# Patient Record
Sex: Female | Born: 1980 | Race: White | Hispanic: No | Marital: Married | Smoking: Former smoker
Health system: Southern US, Community
[De-identification: ages and names within clinical notes are randomized; demographics above are authoritative.]

## PROBLEM LIST (undated history)

## (undated) DIAGNOSIS — I10 Essential (primary) hypertension: Secondary | ICD-10-CM

## (undated) DIAGNOSIS — N200 Calculus of kidney: Secondary | ICD-10-CM

## (undated) HISTORY — PX: CHOLECYSTECTOMY: SHX55

## (undated) HISTORY — PX: ABDOMINAL HYSTERECTOMY: SHX81

---

## 2001-12-27 ENCOUNTER — Emergency Department (HOSPITAL_COMMUNITY): Admission: EM | Admit: 2001-12-27 | Discharge: 2001-12-27 | Payer: Self-pay | Admitting: Emergency Medicine

## 2002-02-26 ENCOUNTER — Emergency Department (HOSPITAL_COMMUNITY): Admission: EM | Admit: 2002-02-26 | Discharge: 2002-02-26 | Payer: Self-pay | Admitting: Emergency Medicine

## 2003-07-07 ENCOUNTER — Emergency Department (HOSPITAL_COMMUNITY): Admission: EM | Admit: 2003-07-07 | Discharge: 2003-07-07 | Payer: Self-pay | Admitting: Emergency Medicine

## 2003-09-13 ENCOUNTER — Emergency Department (HOSPITAL_COMMUNITY): Admission: EM | Admit: 2003-09-13 | Discharge: 2003-09-13 | Payer: Self-pay | Admitting: Emergency Medicine

## 2004-04-04 ENCOUNTER — Emergency Department (HOSPITAL_COMMUNITY): Admission: EM | Admit: 2004-04-04 | Discharge: 2004-04-04 | Payer: Self-pay | Admitting: Emergency Medicine

## 2004-08-12 ENCOUNTER — Emergency Department (HOSPITAL_COMMUNITY): Admission: EM | Admit: 2004-08-12 | Discharge: 2004-08-12 | Payer: Self-pay | Admitting: Emergency Medicine

## 2006-04-14 ENCOUNTER — Emergency Department (HOSPITAL_COMMUNITY): Admission: EM | Admit: 2006-04-14 | Discharge: 2006-04-14 | Payer: Self-pay | Admitting: Emergency Medicine

## 2006-04-17 ENCOUNTER — Emergency Department (HOSPITAL_COMMUNITY): Admission: EM | Admit: 2006-04-17 | Discharge: 2006-04-17 | Payer: Self-pay | Admitting: Emergency Medicine

## 2006-10-16 ENCOUNTER — Emergency Department (HOSPITAL_COMMUNITY): Admission: EM | Admit: 2006-10-16 | Discharge: 2006-10-16 | Payer: Self-pay | Admitting: Emergency Medicine

## 2008-03-04 ENCOUNTER — Emergency Department (HOSPITAL_COMMUNITY): Admission: EM | Admit: 2008-03-04 | Discharge: 2008-03-04 | Payer: Self-pay | Admitting: Emergency Medicine

## 2008-08-01 ENCOUNTER — Emergency Department (HOSPITAL_COMMUNITY): Admission: EM | Admit: 2008-08-01 | Discharge: 2008-08-01 | Payer: Self-pay | Admitting: Emergency Medicine

## 2008-12-01 ENCOUNTER — Emergency Department (HOSPITAL_COMMUNITY): Admission: EM | Admit: 2008-12-01 | Discharge: 2008-12-01 | Payer: Self-pay | Admitting: Emergency Medicine

## 2009-04-17 ENCOUNTER — Emergency Department (HOSPITAL_COMMUNITY): Admission: EM | Admit: 2009-04-17 | Discharge: 2009-04-17 | Payer: Self-pay | Admitting: Emergency Medicine

## 2009-06-11 ENCOUNTER — Emergency Department (HOSPITAL_COMMUNITY): Admission: EM | Admit: 2009-06-11 | Discharge: 2009-06-11 | Payer: Self-pay | Admitting: Emergency Medicine

## 2009-06-16 ENCOUNTER — Emergency Department (HOSPITAL_COMMUNITY): Admission: EM | Admit: 2009-06-16 | Discharge: 2009-06-16 | Payer: Self-pay | Admitting: Emergency Medicine

## 2010-04-30 ENCOUNTER — Emergency Department (HOSPITAL_COMMUNITY)
Admission: EM | Admit: 2010-04-30 | Discharge: 2010-04-30 | Disposition: A | Discharge status: Home / Self Care | Payer: PRIVATE HEALTH INSURANCE | Attending: Emergency Medicine | Admitting: Emergency Medicine

## 2010-04-30 DIAGNOSIS — K2900 Acute gastritis without bleeding: Secondary | ICD-10-CM | POA: Insufficient documentation

## 2010-04-30 DIAGNOSIS — R109 Unspecified abdominal pain: Secondary | ICD-10-CM | POA: Insufficient documentation

## 2010-04-30 DIAGNOSIS — D649 Anemia, unspecified: Secondary | ICD-10-CM | POA: Insufficient documentation

## 2010-04-30 LAB — DIFFERENTIAL
Basophils Relative: 0 % (ref 0–1)
Eosinophils Absolute: 0.2 10*3/uL (ref 0.0–0.7)
Eosinophils Relative: 2 % (ref 0–5)
Lymphs Abs: 2.7 10*3/uL (ref 0.7–4.0)
Monocytes Absolute: 0.6 10*3/uL (ref 0.1–1.0)
Monocytes Relative: 6 % (ref 3–12)
Neutro Abs: 6.6 10*3/uL (ref 1.7–7.7)
Neutrophils Relative %: 66 % (ref 43–77)

## 2010-04-30 LAB — COMPREHENSIVE METABOLIC PANEL
ALT: 26 U/L (ref 0–35)
AST: 39 U/L — ABNORMAL HIGH (ref 0–37)
Albumin: 3.7 g/dL (ref 3.5–5.2)
Alkaline Phosphatase: 77 U/L (ref 39–117)
CO2: 27 mEq/L (ref 19–32)
Calcium: 9.4 mg/dL (ref 8.4–10.5)
Chloride: 102 mEq/L (ref 96–112)
Creatinine, Ser: 0.56 mg/dL (ref 0.4–1.2)
GFR calc Af Amer: >60 mL/min (ref >60)
GFR calc non Af Amer: >60 mL/min (ref >60)

## 2010-04-30 LAB — CBC
Hemoglobin: 9.4 g/dL — ABNORMAL LOW (ref 12.0–15.0)
MCV: 76.9 fL — ABNORMAL LOW (ref 78.0–100.0)
Platelets: 376 10*3/uL (ref 150–400)

## 2010-04-30 LAB — URINALYSIS, ROUTINE W REFLEX MICROSCOPIC
Bilirubin Urine: NEGATIVE
Glucose, UA: NEGATIVE mg/dL
Nitrite: NEGATIVE
Urobilinogen, UA: 0.2 mg/dL (ref 0.0–1.0)

## 2010-04-30 LAB — URINE MICROSCOPIC-ADD ON

## 2010-04-30 LAB — POCT PREGNANCY, URINE: Preg Test, Ur: NEGATIVE

## 2010-04-30 LAB — LIPASE, BLOOD: Lipase: 25 U/L (ref 11–59)

## 2010-05-19 LAB — URINALYSIS, ROUTINE W REFLEX MICROSCOPIC
Glucose, UA: 100 mg/dL — AB
Ketones, ur: NEGATIVE mg/dL
Ketones, ur: NEGATIVE mg/dL
Leukocytes, UA: NEGATIVE
Nitrite: POSITIVE — AB
Specific Gravity, Urine: >1.03 — ABNORMAL HIGH (ref 1.005–1.030)
pH: 6 (ref 5.0–8.0)

## 2010-05-19 LAB — URINE MICROSCOPIC-ADD ON

## 2010-05-19 LAB — PREGNANCY, URINE: Preg Test, Ur: NEGATIVE

## 2010-06-03 LAB — URINALYSIS, ROUTINE W REFLEX MICROSCOPIC
Bilirubin Urine: NEGATIVE
Glucose, UA: NEGATIVE mg/dL
Hgb urine dipstick: NEGATIVE
Ketones, ur: NEGATIVE mg/dL
Nitrite: NEGATIVE
Protein, ur: NEGATIVE mg/dL
Specific Gravity, Urine: 1.025 (ref 1.005–1.030)
Urobilinogen, UA: 0.2 mg/dL (ref 0.0–1.0)
pH: 5.5 (ref 5.0–8.0)

## 2010-06-03 LAB — URINE CULTURE
Colony Count: NO GROWTH
Culture: NO GROWTH

## 2010-09-10 ENCOUNTER — Emergency Department (HOSPITAL_COMMUNITY)
Admission: EM | Admit: 2010-09-10 | Discharge: 2010-09-10 | Disposition: A | Discharge status: Home / Self Care | Payer: PRIVATE HEALTH INSURANCE | Attending: Emergency Medicine | Admitting: Emergency Medicine

## 2010-09-10 ENCOUNTER — Emergency Department (HOSPITAL_COMMUNITY): Payer: PRIVATE HEALTH INSURANCE

## 2010-09-10 ENCOUNTER — Encounter: Payer: Self-pay | Admitting: Emergency Medicine

## 2010-09-10 DIAGNOSIS — E119 Type 2 diabetes mellitus without complications: Secondary | ICD-10-CM | POA: Insufficient documentation

## 2010-09-10 DIAGNOSIS — S93409A Sprain of unspecified ligament of unspecified ankle, initial encounter: Secondary | ICD-10-CM

## 2010-09-10 DIAGNOSIS — W010XXA Fall on same level from slipping, tripping and stumbling without subsequent striking against object, initial encounter: Secondary | ICD-10-CM | POA: Insufficient documentation

## 2010-09-10 DIAGNOSIS — Z87891 Personal history of nicotine dependence: Secondary | ICD-10-CM | POA: Insufficient documentation

## 2010-09-10 DIAGNOSIS — S8000XA Contusion of unspecified knee, initial encounter: Secondary | ICD-10-CM

## 2010-09-10 DIAGNOSIS — IMO0002 Reserved for concepts with insufficient information to code with codable children: Secondary | ICD-10-CM | POA: Insufficient documentation

## 2010-09-10 MED ORDER — ONDANSETRON HCL 4 MG PO TABS
4.0000 mg | ORAL_TABLET | Freq: Once | ORAL | Status: AC
  Administered 2010-09-10: 4 mg via ORAL
  Filled 2010-09-10: qty 1

## 2010-09-10 MED ORDER — HYDROCODONE-ACETAMINOPHEN 5-325 MG PO TABS
2.0000 | ORAL_TABLET | Freq: Once | ORAL | Status: AC
  Administered 2010-09-10: 2 via ORAL
  Filled 2010-09-10: qty 2

## 2010-09-10 MED ORDER — HYDROCODONE-ACETAMINOPHEN 5-500 MG PO TABS: 1.0000 | ORAL_TABLET | Freq: Four times a day (QID) | ORAL | Status: AC | PRN

## 2010-09-10 NOTE — ED Notes (Signed)
Patient states she slipped on her steps this afternoon and landed on left knee; c/o increased pain

## 2010-09-10 NOTE — ED Provider Notes (Signed)
History     Chief Complaint  Patient presents with  . Knee Pain  . Ankle Pain   Patient is a 30 y.o. female presenting with knee pain. The history is provided by the patient.  Knee Pain This is a new problem. The current episode started today. The problem has been unchanged. Associated symptoms include arthralgias. Pertinent negatives include no abdominal pain, chest pain, coughing, joint swelling or neck pain. The symptoms are aggravated by standing and walking. She has tried nothing for the symptoms. The treatment provided no relief.    Past Medical History  Diagnosis Date  . Diabetes mellitus     History reviewed. No pertinent past surgical history.  History reviewed. No pertinent family history.  History  Substance Use Topics  . Smoking status: Former Games developer  . Smokeless tobacco: Not on file  . Alcohol Use: No    OB History    Grav Para Term Preterm Abortions TAB SAB Ect Mult Living                  Review of Systems  Constitutional: Negative for activity change.       All ROS Neg except as noted in HPI  HENT: Negative for nosebleeds and neck pain.   Eyes: Negative for photophobia and discharge.  Respiratory: Negative for cough, shortness of breath and wheezing.   Cardiovascular: Negative for chest pain and palpitations.  Gastrointestinal: Negative for abdominal pain and blood in stool.  Genitourinary: Negative for dysuria, frequency and hematuria.  Musculoskeletal: Positive for arthralgias and gait problem. Negative for joint swelling.  Skin: Negative.   Neurological: Negative for dizziness, seizures and speech difficulty.  Psychiatric/Behavioral: Negative for hallucinations and confusion.    Physical Exam  BP 158/99  Pulse 96  Temp(Src) 97.6 F (36.4 C) (Oral)  Resp 20  Ht 5\' 9"  (1.753 m)  Wt 366 lb (166.017 kg)  BMI 54.05 kg/m2  SpO2 97%  LMP 07/18/2010  Physical Exam  Nursing note and vitals reviewed. Constitutional: She is oriented to person,  place, and time. She appears well-developed and well-nourished.  Non-toxic appearance.  HENT:  Head: Normocephalic.  Right Ear: Tympanic membrane and external ear normal.  Left Ear: Tympanic membrane and external ear normal.  Eyes: EOM and lids are normal. Pupils are equal, round, and reactive to light.  Neck: Normal range of motion. Neck supple. Carotid bruit is not present.  Cardiovascular: Normal rate, regular rhythm, normal heart sounds, intact distal pulses and normal pulses.   Pulmonary/Chest: Breath sounds normal. No respiratory distress.  Abdominal: Soft. Bowel sounds are normal. There is no tenderness. There is no guarding.  Musculoskeletal: Normal range of motion.       Left knee sore to palpation. No effusion. No deformity. Left ankle sore to ROM. No deformity. Distal pulse and sensory wnl.  Lymphadenopathy:       Head (right side): No submandibular adenopathy present.       Head (left side): No submandibular adenopathy present.    She has no cervical adenopathy.  Neurological: She is alert and oriented to person, place, and time. She has normal strength. No cranial nerve deficit or sensory deficit.  Skin: Skin is warm and dry.  Psychiatric: She has a normal mood and affect. Her speech is normal.    ED Course  Procedures  MDM I have reviewed nursing notes, vital signs, and all appropriate lab and imaging results for this patient.      Kathie Dike, [place] 09/10/10  2121 

## 2010-11-05 ENCOUNTER — Emergency Department (HOSPITAL_COMMUNITY)
Admission: EM | Admit: 2010-11-05 | Discharge: 2010-11-05 | Disposition: A | Discharge status: Home / Self Care | Payer: PRIVATE HEALTH INSURANCE | Attending: Emergency Medicine | Admitting: Emergency Medicine

## 2010-11-05 ENCOUNTER — Other Ambulatory Visit: Payer: Self-pay

## 2010-11-05 ENCOUNTER — Emergency Department (HOSPITAL_COMMUNITY): Payer: PRIVATE HEALTH INSURANCE

## 2010-11-05 ENCOUNTER — Encounter (HOSPITAL_COMMUNITY): Payer: Self-pay | Admitting: *Deleted

## 2010-11-05 DIAGNOSIS — F17200 Nicotine dependence, unspecified, uncomplicated: Secondary | ICD-10-CM | POA: Insufficient documentation

## 2010-11-05 DIAGNOSIS — R002 Palpitations: Secondary | ICD-10-CM | POA: Insufficient documentation

## 2010-11-05 DIAGNOSIS — E119 Type 2 diabetes mellitus without complications: Secondary | ICD-10-CM | POA: Insufficient documentation

## 2010-11-05 DIAGNOSIS — I10 Essential (primary) hypertension: Secondary | ICD-10-CM

## 2010-11-05 LAB — GLUCOSE, CAPILLARY: Glucose-Capillary: 234 mg/dL — ABNORMAL HIGH (ref 70–99)

## 2010-11-05 LAB — POCT PREGNANCY, URINE: Preg Test, Ur: NEGATIVE

## 2010-11-05 MED ORDER — LORAZEPAM 1 MG PO TABS
1.0000 mg | ORAL_TABLET | Freq: Once | ORAL | Status: AC
  Administered 2010-11-05: 1 mg via ORAL
  Filled 2010-11-05: qty 1

## 2010-11-05 NOTE — ED Notes (Signed)
C/o intermittent palpitations, chest pressure and "feeling shaky" x 1 week

## 2010-11-05 NOTE — ED Notes (Signed)
Complain of burning in epigastric pain at times

## 2010-11-05 NOTE — ED Provider Notes (Signed)
History     CSN: [id number] Arrival date & time: 11/05/2010 12:38 PM Pt seen at 1313  Chief Complaint  Patient presents with  . Palpitations   Patient is a 30 y.o. female presenting with palpitations. The history is provided by the patient.  Palpitations  This is a new problem. The current episode started more than 2 days ago. The problem occurs daily. The problem has been gradually improving. On average, each episode lasts 15 minutes. Associated symptoms include chest pressure, dizziness and shortness of breath. Pertinent negatives include no diaphoresis, no fever, no chest pain, no exertional chest pressure, no irregular heartbeat, no orthopnea, no PND, no syncope, no nausea, no vomiting, no leg pain, no lower extremity edema and no cough.  no recent travel/surgery No h/o DVT/PE No premature fam h/o CAD or h/o sudden death No new meds No drug use Reports about 10 episodes in past week Stop on their own Reports starts as palpitation/heart racing then feels light chest pressure and can't take a deep breath.  No pleuritic type CP   Past Medical History  Diagnosis Date  . Diabetes mellitus     Past Surgical History  Procedure Date  . Cholecystectomy     No family history on file.  History  Substance Use Topics  . Smoking status: Current Some Day Smoker  . Smokeless tobacco: Not on file  . Alcohol Use: No    OB History    Grav Para Term Preterm Abortions TAB SAB Ect Mult Living                  Review of Systems  Constitutional: Negative for fever and diaphoresis.  Respiratory: Positive for shortness of breath. Negative for cough.   Cardiovascular: Positive for palpitations. Negative for chest pain, orthopnea, syncope and PND.  Gastrointestinal: Negative for nausea and vomiting.  Neurological: Positive for dizziness.  All other systems reviewed and are negative.    Physical Exam  BP 157/104  Pulse 103  Temp(Src) 98.5 F (36.9 C) (Oral)  Resp 20  Ht 5\' 9"   (1.753 m)  Wt 366 lb (166.017 kg)  BMI 54.05 kg/m2  SpO2 99%  LMP 11/04/2010  Physical Exam  CONSTITUTIONAL: Well developed/well nourished HEAD AND FACE: Normocephalic/atraumatic EYES: EOMI/PERRL ENMT: Mucous membranes moist NECK: supple no meningeal signs CV: S1/S2 noted, no murmurs/rubs/gallops noted LUNGS: Lungs are clear to auscultation bilaterally, no apparent distress ABDOMEN: soft, nontender, no rebound or guarding, obese NEURO: Pt is awake/alert, moves all extremitiesx4 EXTREMITIES: pulses normal, full ROM, no tenderness SKIN: warm, color normal PSYCH: no abnormalities of mood noted   ED Course  Procedures  MDM Nursing notes reviewed and considered in documentation xrays reviewed and considered All vitals reviewed and considered  Labs reviewed/considered    Date: 11/05/2010  Rate: 94  Rhythm: normal sinus rhythm  QRS Axis: normal  Intervals: normal  ST/T Wave abnormalities: nonspecific ST changes  Conduction Disutrbances:none  Narrative Interpretation: no pr shortening/pronlonging, no delta waves  Old EKG Reviewed: none available   Pt well appearing, no distress, doubt significant dysrhytmia, doubt PE.   Advised close outpatient followup, discussed return precautions   Joya Gaskins, MD 11/05/10 1438

## 2011-02-24 ENCOUNTER — Encounter (HOSPITAL_COMMUNITY): Payer: Self-pay | Admitting: *Deleted

## 2011-02-24 ENCOUNTER — Emergency Department (HOSPITAL_COMMUNITY)
Admission: EM | Admit: 2011-02-24 | Discharge: 2011-02-24 | Disposition: A | Discharge status: Home / Self Care | Payer: No Typology Code available for payment source | Attending: Emergency Medicine | Admitting: Emergency Medicine

## 2011-02-24 DIAGNOSIS — Z9889 Other specified postprocedural states: Secondary | ICD-10-CM | POA: Insufficient documentation

## 2011-02-24 DIAGNOSIS — L089 Local infection of the skin and subcutaneous tissue, unspecified: Secondary | ICD-10-CM

## 2011-02-24 DIAGNOSIS — R10819 Abdominal tenderness, unspecified site: Secondary | ICD-10-CM | POA: Insufficient documentation

## 2011-02-24 MED ORDER — HYDROCODONE-ACETAMINOPHEN 5-325 MG PO TABS: 1.0000 | ORAL_TABLET | Freq: Four times a day (QID) | ORAL | Status: AC | PRN

## 2011-02-24 MED ORDER — DOXYCYCLINE HYCLATE 100 MG PO CAPS: 100.0000 mg | ORAL_CAPSULE | Freq: Two times a day (BID) | ORAL | Status: AC

## 2011-02-24 NOTE — ED Notes (Signed)
Surgical wound site red and draining

## 2011-02-24 NOTE — ED Notes (Signed)
Patient had robotic surgery at [facility], c/o abdominal pain and did not want to return to [facility] for further treatment

## 2011-02-24 NOTE — ED Provider Notes (Signed)
History   Scribed for Benny Lennert, MD, the patient was seen in room APA17/APA17 . This chart was scribed by Lewanda Rife.   CSN: [id number]  Arrival date & time 02/24/11  1919   First MD Initiated Contact with Patient 02/24/11 2013      Chief Complaint  Patient presents with  . Wound Infection    (Consider location/radiation/quality/duration/timing/severity/associated sxs/prior treatment) HPI Olivia Webb is a 30 y.o. female who presents to the Emergency Department complaining of a possible infection over the incision sites of her hysterectomy for the past 4 days. Pt describes her pain at waxing and waning and rates her pain at 7 out of 10 at its worse. Pt reports moderate to severe tenderness over suprapubic region and incision sites. Pt reports associated redness and yellow drainage along incision sites. Pt reports she ran out of pan medicine. Pt denies fever and urinary sxs.  Pt reports she had her hysterectomy on 02/10/11 and is scheduled to follow up 03/13/10.     Past Medical History  Diagnosis Date  . Diabetes mellitus     Past Surgical History  Procedure Date  . Cholecystectomy   . Abdominal hysterectomy     No family history on file.  History  Substance Use Topics  . Smoking status: Current Some Day Smoker  . Smokeless tobacco: Not on file  . Alcohol Use: No    OB History    Grav Para Term Preterm Abortions TAB SAB Ect Mult Living                  Review of Systems  Allergies  Review of patient's allergies indicates no known allergies.  Home Medications   Current Outpatient Rx  Name Route Sig Dispense Refill  . DIPHENHYDRAMINE HCL 25 MG PO CAPS Oral Take 25 mg by mouth at bedtime as needed. She is allergic to cats and parents have one in household     . HYDROCODONE-ACETAMINOPHEN 5-500 MG PO TABS Oral Take 1-2 tablets by mouth every 4 (four) hours as needed. For pain     . PROMETHAZINE HCL 25 MG PO TABS Oral Take 25 mg by mouth every 6  (six) hours as needed. For nausea     . DOXYCYCLINE HYCLATE 100 MG PO CAPS Oral Take 1 capsule (100 mg total) by mouth 2 (two) times daily. 20 capsule 0  . HYDROCODONE-ACETAMINOPHEN 5-325 MG PO TABS Oral Take 1 tablet by mouth every 6 (six) hours as needed for pain. 20 tablet 0    BP 156/95  Pulse 94  Temp(Src) 98.3 F (36.8 C) (Oral)  Resp 20  Ht 5\' 10"  (1.778 m)  Wt 346 lb (156.945 kg)  BMI 49.65 kg/m2  SpO2 100%  Physical Exam  Constitutional: She is oriented to person, place, and time. She appears well-developed.  HENT:  Head: Normocephalic and atraumatic.  Eyes: Conjunctivae and EOM are normal. No scleral icterus.  Neck: Neck supple. No thyromegaly present.  Cardiovascular: Normal rate and regular rhythm.  Exam reveals no gallop and no friction rub.   No murmur heard. Pulmonary/Chest: No stridor. She has no wheezes. She has no rales. She exhibits no tenderness.  Abdominal: She exhibits no distension. There is tenderness (over mid abdomen hysterectomy incision and suprapubic region). There is no rebound.       Healing scars noted from hysterectomy   mid abdomen incision about 2 cm in diameter is most tender and drainage is noted  Moderate tenderness over suprapubic region  Musculoskeletal: Normal range of motion. She exhibits no edema.  Lymphadenopathy:    She has no cervical adenopathy.  Neurological: She is oriented to person, place, and time. Coordination normal.  Skin: No rash noted. No erythema.  Psychiatric: She has a normal mood and affect. Her behavior is normal.    ED Course  Procedures (including critical care time)   Labs Reviewed  WOUND CULTURE   No results found.   1. Skin infection       MDM      The chart was scribed for me under my direct supervision.  I personally performed the history, physical, and medical decision making and all procedures in the evaluation of this patient.Benny Lennert, MD 02/24/11 [phone]

## 2011-02-27 LAB — WOUND CULTURE

## 2011-05-31 ENCOUNTER — Emergency Department (HOSPITAL_COMMUNITY)
Admission: EM | Admit: 2011-05-31 | Discharge: 2011-05-31 | Disposition: A | Discharge status: Home / Self Care | Payer: PRIVATE HEALTH INSURANCE | Attending: Emergency Medicine | Admitting: Emergency Medicine

## 2011-05-31 ENCOUNTER — Encounter (HOSPITAL_COMMUNITY): Payer: Self-pay | Admitting: Emergency Medicine

## 2011-05-31 DIAGNOSIS — E119 Type 2 diabetes mellitus without complications: Secondary | ICD-10-CM | POA: Insufficient documentation

## 2011-05-31 DIAGNOSIS — F17200 Nicotine dependence, unspecified, uncomplicated: Secondary | ICD-10-CM | POA: Insufficient documentation

## 2011-05-31 DIAGNOSIS — J329 Chronic sinusitis, unspecified: Secondary | ICD-10-CM

## 2011-05-31 MED ORDER — FEXOFENADINE-PSEUDOEPHED ER 60-120 MG PO TB12: 1.0000 | ORAL_TABLET | Freq: Two times a day (BID) | ORAL | Status: DC

## 2011-05-31 MED ORDER — HYDROCODONE-IBUPROFEN 7.5-200 MG PO TABS: 1.0000 | ORAL_TABLET | Freq: Four times a day (QID) | ORAL | Status: AC | PRN

## 2011-05-31 NOTE — ED Provider Notes (Signed)
History     CSN: [id number]  Arrival date & time 05/31/11  1725   First MD Initiated Contact with Patient 05/31/11 1842      Chief Complaint  Patient presents with  . Nasal Congestion  . Facial Pain    (Consider location/radiation/quality/duration/timing/severity/associated sxs/prior treatment) HPI Comments: Patient reports 3 days of sinus congestion and pressure. Most recently this is accompanied by a pressure type headache mostly behind the eyes but also in the sinus areas. The patient states occasionally she has had some problems with blood-tinged mucus when she blows her nose. There's been no high fever. There's been no vomiting. His been no injury or trauma reported.  The history is provided by the patient.    Past Medical History  Diagnosis Date  . Diabetes mellitus     Past Surgical History  Procedure Date  . Cholecystectomy   . Abdominal hysterectomy     No family history on file.  History  Substance Use Topics  . Smoking status: Current Some Day Smoker  . Smokeless tobacco: Not on file  . Alcohol Use: No    OB History    Grav Para Term Preterm Abortions TAB SAB Ect Mult Living                  Review of Systems  Allergies  Review of patient's allergies indicates no known allergies.  Home Medications   Current Outpatient Rx  Name Route Sig Dispense Refill  . DIPHENHYDRAMINE HCL 25 MG PO CAPS Oral Take 25 mg by mouth at bedtime as needed. She is allergic to cats and parents have one in household     . FEXOFENADINE-PSEUDOEPHED ER 60-120 MG PO TB12 Oral Take 1 tablet by mouth every 12 (twelve) hours. 20 tablet 0  . HYDROCODONE-ACETAMINOPHEN 5-500 MG PO TABS Oral Take 1-2 tablets by mouth every 4 (four) hours as needed. For pain     . HYDROCODONE-IBUPROFEN 7.5-200 MG PO TABS Oral Take 1 tablet by mouth every 6 (six) hours as needed for pain. 15 tablet 0  . PROMETHAZINE HCL 25 MG PO TABS Oral Take 25 mg by mouth every 6 (six) hours as needed. For nausea        BP 176/117  Pulse 92  Temp(Src) 98.2 F (36.8 C) (Oral)  Resp 20  Ht 5\' 10"  (1.778 m)  Wt 326 lb (147.873 kg)  BMI 46.78 kg/m2  SpO2 98%  LMP 11/04/2010  Physical Exam  Nursing note and vitals reviewed. Constitutional: She is oriented to person, place, and time. She appears well-developed and well-nourished.  Non-toxic appearance.  HENT:  Head: Normocephalic.  Right Ear: Tympanic membrane and external ear normal.  Left Ear: Tympanic membrane and external ear normal.       Nasal congestion noted. Mild pain to percussion of the sinuses.  Eyes: EOM and lids are normal. Pupils are equal, round, and reactive to light.  Neck: Normal range of motion. Neck supple. Carotid bruit is not present.  Cardiovascular: Normal rate, regular rhythm, normal heart sounds, intact distal pulses and normal pulses.   Pulmonary/Chest: Breath sounds normal. No respiratory distress.  Abdominal: Soft. Bowel sounds are normal. There is no tenderness. There is no guarding.  Musculoskeletal: Normal range of motion.  Lymphadenopathy:       Head (right side): No submandibular adenopathy present.       Head (left side): No submandibular adenopathy present.    She has no cervical adenopathy.  Neurological: She is alert and oriented  to person, place, and time. She has normal strength. No cranial nerve deficit or sensory deficit.  Skin: Skin is warm and dry.  Psychiatric: She has a normal mood and affect. Her speech is normal.   and a  ED Course  Procedures (including critical care time)  Labs Reviewed - No data to display No results found.   1. Sinusitis       MDM  I have reviewed nursing notes, vital signs, and all appropriate lab and imaging results for this patient. Vital signs reviewed. Patient has an elevation in blood pressure 176/117. Patient advised to have this rechecked either by her primary physician, or at the health department. Examination is consistent with a sinusitis and upper  respiratory infection. Patient is treated with Claritin-D every 12 hours, and Vicoprofen for pain.       Kathie Dike, [place] 05/31/11 1857

## 2011-05-31 NOTE — ED Notes (Signed)
Pt presents with sinus pressure and clear nasal drainage x 4 days. Pt denies fever and sore throat.

## 2011-05-31 NOTE — Discharge Instructions (Signed)
Please increase fluids. Tylenol every 4 hours as needed for fever. Allegra-D every 12 hours for congestion and pressure. Vicoprofen every 6 hours for sinus headache if needed. This medication may cause drowsiness, please use with caution.Sinusitis Sinusitis an infection of the air pockets (sinuses) in your face. This can cause puffiness (swelling). It can also cause drainage from your sinuses.  HOME CARE   Only take medicine as told by your doctor.   Drink enough fluids to keep your pee (urine) clear or pale yellow.   Apply moist heat or ice packs for pain relief.   Use salt (saline) nose sprays. The spray will wet the thick fluid in the nose. This can help the sinuses drain.  GET HELP RIGHT AWAY IF:   You have a fever.   Your baby is older than 3 months with a rectal temperature of 102 F (38.9 C) or higher.   Your baby is 83 months old or younger with a rectal temperature of 100.4 F (38 C) or higher.   The pain gets worse.   You get a very bad headache.   You keep throwing up (vomiting).   Your face gets puffy.  MAKE SURE YOU:   Understand these instructions.   Will watch your condition.   Will get help right away if you are not doing well or get worse.  Document Released: 08/03/2007 Document Revised: 02/03/2011 Document Reviewed: 08/03/2007 [facility] Patient Information 2012 [place], [place].

## 2011-05-31 NOTE — ED Notes (Signed)
Pt c/o sinus congestion/pain x 3 days

## 2011-06-05 NOTE — ED Provider Notes (Signed)
Evaluation and management procedures were performed by the PA/NP/Resident Physician under my supervision/collaboration.   Felisa Bonier, MD 06/05/11 [phone]

## 2011-07-01 ENCOUNTER — Emergency Department (HOSPITAL_COMMUNITY)
Admission: EM | Admit: 2011-07-01 | Discharge: 2011-07-01 | Disposition: A | Discharge status: Home / Self Care | Payer: No Typology Code available for payment source | Attending: Emergency Medicine | Admitting: Emergency Medicine

## 2011-07-01 ENCOUNTER — Encounter (HOSPITAL_COMMUNITY): Payer: Self-pay | Admitting: *Deleted

## 2011-07-01 DIAGNOSIS — K0889 Other specified disorders of teeth and supporting structures: Secondary | ICD-10-CM

## 2011-07-01 DIAGNOSIS — F17200 Nicotine dependence, unspecified, uncomplicated: Secondary | ICD-10-CM | POA: Insufficient documentation

## 2011-07-01 DIAGNOSIS — K089 Disorder of teeth and supporting structures, unspecified: Secondary | ICD-10-CM | POA: Insufficient documentation

## 2011-07-01 DIAGNOSIS — E119 Type 2 diabetes mellitus without complications: Secondary | ICD-10-CM | POA: Insufficient documentation

## 2011-07-01 MED ORDER — PENICILLIN V POTASSIUM 500 MG PO TABS: 500.0000 mg | ORAL_TABLET | Freq: Four times a day (QID) | ORAL | Status: AC

## 2011-07-01 MED ORDER — PENICILLIN V POTASSIUM 250 MG PO TABS
500.0000 mg | ORAL_TABLET | Freq: Once | ORAL | Status: AC
  Administered 2011-07-01: 500 mg via ORAL
  Filled 2011-07-01: qty 2

## 2011-07-01 MED ORDER — HYDROCODONE-ACETAMINOPHEN 5-325 MG PO TABS: 1.0000 | ORAL_TABLET | Freq: Four times a day (QID) | ORAL | Status: AC | PRN

## 2011-07-01 MED ORDER — HYDROCODONE-ACETAMINOPHEN 5-325 MG PO TABS
1.0000 | ORAL_TABLET | Freq: Once | ORAL | Status: AC
  Administered 2011-07-01: 1 via ORAL
  Filled 2011-07-01: qty 1

## 2011-07-01 MED ORDER — IBUPROFEN 800 MG PO TABS
800.0000 mg | ORAL_TABLET | Freq: Once | ORAL | Status: AC
  Administered 2011-07-01: 800 mg via ORAL
  Filled 2011-07-01: qty 1

## 2011-07-01 NOTE — ED Provider Notes (Signed)
History     CSN: [id number]  Arrival date & time 07/01/11  1041   First MD Initiated Contact with Patient 07/01/11 1131      Chief Complaint  Patient presents with  . Dental Pain    (Consider location/radiation/quality/duration/timing/severity/associated sxs/prior treatment) HPI Comments: Has dental appt for extractions on August 15, 2011.  Patient is a 31 y.o. female presenting with tooth pain. The history is provided by the patient. No language interpreter was used.  Dental PainThe primary symptoms include mouth pain. Primary symptoms do not include dental injury, oral bleeding or fever. Episode onset: 3-4 days ago. The symptoms are unchanged. The symptoms occur constantly.  Additional symptoms include: jaw pain. Additional symptoms do not include: purulent gums, facial swelling and trouble swallowing.    Past Medical History  Diagnosis Date  . Diabetes mellitus     Past Surgical History  Procedure Date  . Cholecystectomy   . Abdominal hysterectomy     Family History  Problem Relation Age of Onset  . Diabetes Mother   . Heart failure Father   . Cancer Brother     History  Substance Use Topics  . Smoking status: Current Some Day Smoker  . Smokeless tobacco: Not on file  . Alcohol Use: No    OB History    Grav Para Term Preterm Abortions TAB SAB Ect Mult Living                  Review of Systems  Constitutional: Negative for fever and chills.  HENT: Positive for dental problem. Negative for facial swelling, mouth sores and trouble swallowing.   All other systems reviewed and are negative.    Allergies  Review of patient's allergies indicates no known allergies.  Home Medications   Current Outpatient Rx  Name Route Sig Dispense Refill  . DIPHENHYDRAMINE HCL 25 MG PO CAPS Oral Take 25 mg by mouth at bedtime as needed. She is allergic to cats and parents have one in household     . FEXOFENADINE-PSEUDOEPHED ER 60-120 MG PO TB12 Oral Take 1 tablet by mouth  every 12 (twelve) hours. 20 tablet 0  . HYDROCODONE-ACETAMINOPHEN 5-325 MG PO TABS Oral Take 1 tablet by mouth every 6 (six) hours as needed for pain. 20 tablet 0  . HYDROCODONE-ACETAMINOPHEN 5-500 MG PO TABS Oral Take 1-2 tablets by mouth every 4 (four) hours as needed. For pain     . PENICILLIN V POTASSIUM 500 MG PO TABS Oral Take 1 tablet (500 mg total) by mouth 4 (four) times daily. 40 tablet 0  . PROMETHAZINE HCL 25 MG PO TABS Oral Take 25 mg by mouth every 6 (six) hours as needed. For nausea       BP 148/105  Pulse 97  Temp(Src) 98.4 F (36.9 C) (Oral)  Resp 20  Ht 5\' 10"  (1.778 m)  Wt 322 lb (146.058 kg)  BMI 46.20 kg/m2  SpO2 100%  LMP 11/04/2010  Physical Exam  Nursing note and vitals reviewed. Constitutional: She is oriented to person, place, and time. She appears well-developed and well-nourished. No distress.  HENT:  Head: Normocephalic and atraumatic.  Mouth/Throat: No oropharyngeal exudate.    Eyes: EOM are normal.  Neck: Normal range of motion.  Cardiovascular: Normal rate, regular rhythm and normal heart sounds.   Pulmonary/Chest: Effort normal and breath sounds normal.  Abdominal: Soft. She exhibits no distension. There is no tenderness.  Musculoskeletal: Normal range of motion.  Neurological: She is alert and oriented to  person, place, and time.  Skin: Skin is warm and dry.  Psychiatric: She has a normal mood and affect. Judgment normal.    ED Course  Procedures (including critical care time)  Labs Reviewed - No data to display No results found.   1. Pain, dental       MDM  No evidence of abscess F/u with your dentist as planned.        Worthy Rancher, PA 07/01/11 1154

## 2011-07-01 NOTE — ED Notes (Signed)
Pain lt jaw, and lt ear,  Cough for several weeks.

## 2011-07-01 NOTE — ED Notes (Signed)
Pt presents with dental pain in left lower molar. Pt states began approx 3-4 days ago. Pt denies having a Company secretary. Dental referral resource packet given.

## 2011-07-01 NOTE — Discharge Instructions (Signed)
Dental Pain A tooth ache may be caused by cavities (tooth decay). Cavities expose the nerve of the tooth to air and hot or cold temperatures. It may come from an infection or abscess (also called a boil or furuncle) around your tooth. It is also often caused by dental caries (tooth decay). This causes the pain you are having. DIAGNOSIS  Your caregiver can diagnose this problem by exam. TREATMENT   If caused by an infection, it may be treated with medications which kill germs (antibiotics) and pain medications as prescribed by your caregiver. Take medications as directed.   Only take over-the-counter or prescription medicines for pain, discomfort, or fever as directed by your caregiver.   Whether the tooth ache today is caused by infection or dental disease, you should see your dentist as soon as possible for further care.  SEEK MEDICAL CARE IF: The exam and treatment you received today has been provided on an emergency basis only. This is not a substitute for complete medical or dental care. If your problem worsens or new problems (symptoms) appear, and you are unable to meet with your dentist, call or return to this location. SEEK IMMEDIATE MEDICAL CARE IF:   You have a fever.   You develop redness and swelling of your face, jaw, or neck.   You are unable to open your mouth.   You have severe pain uncontrolled by pain medicine.  MAKE SURE YOU:   Understand these instructions.   Will watch your condition.   Will get help right away if you are not doing well or get worse.  Document Released: 02/14/2005 Document Revised: 02/03/2011 Document Reviewed: 10/03/2007 [facility] Patient Information 2012 [place], [place].   Take the meds as directed.  Take ibuprofen up to 800 mg every 8 hrs with food. Follow up with your dentist as planned or sooner if possible.

## 2011-07-02 NOTE — ED Provider Notes (Signed)
Medical screening examination/treatment/procedure(s) were performed by non-physician practitioner and as supervising physician I was immediately available for consultation/collaboration.   Castor Gittleman W Kikue Gerhart, MD 07/02/11 0739 

## 2011-08-29 ENCOUNTER — Encounter (HOSPITAL_COMMUNITY): Payer: Self-pay | Admitting: *Deleted

## 2011-08-29 ENCOUNTER — Emergency Department (HOSPITAL_COMMUNITY)
Admission: EM | Admit: 2011-08-29 | Discharge: 2011-08-29 | Disposition: A | Discharge status: Home / Self Care | Payer: No Typology Code available for payment source | Attending: Emergency Medicine | Admitting: Emergency Medicine

## 2011-08-29 DIAGNOSIS — R112 Nausea with vomiting, unspecified: Secondary | ICD-10-CM | POA: Insufficient documentation

## 2011-08-29 DIAGNOSIS — K529 Noninfective gastroenteritis and colitis, unspecified: Secondary | ICD-10-CM

## 2011-08-29 DIAGNOSIS — R197 Diarrhea, unspecified: Secondary | ICD-10-CM | POA: Insufficient documentation

## 2011-08-29 DIAGNOSIS — Z87891 Personal history of nicotine dependence: Secondary | ICD-10-CM | POA: Insufficient documentation

## 2011-08-29 DIAGNOSIS — Z79899 Other long term (current) drug therapy: Secondary | ICD-10-CM | POA: Insufficient documentation

## 2011-08-29 DIAGNOSIS — E119 Type 2 diabetes mellitus without complications: Secondary | ICD-10-CM

## 2011-08-29 DIAGNOSIS — R1013 Epigastric pain: Secondary | ICD-10-CM | POA: Insufficient documentation

## 2011-08-29 LAB — COMPREHENSIVE METABOLIC PANEL
AST: 49 U/L — ABNORMAL HIGH (ref 0–37)
Albumin: 3.8 g/dL (ref 3.5–5.2)
Alkaline Phosphatase: 101 U/L (ref 39–117)
Chloride: 95 mEq/L — ABNORMAL LOW (ref 96–112)
Potassium: 4 mEq/L (ref 3.5–5.1)
Total Bilirubin: 0.2 mg/dL — ABNORMAL LOW (ref 0.3–1.2)
Total Protein: 8.1 g/dL (ref 6.0–8.3)

## 2011-08-29 LAB — CBC WITH DIFFERENTIAL/PLATELET
Basophils Absolute: 0 10*3/uL (ref 0.0–0.1)
Basophils Relative: 0 % (ref 0–1)
Eosinophils Absolute: 0.2 10*3/uL (ref 0.0–0.7)
MCHC: 33 g/dL (ref 30.0–36.0)
Neutro Abs: 5.8 10*3/uL (ref 1.7–7.7)
Neutrophils Relative %: 63 % (ref 43–77)
Platelets: 295 10*3/uL (ref 150–400)
RDW: 13.1 % (ref 11.5–15.5)

## 2011-08-29 LAB — URINALYSIS, ROUTINE W REFLEX MICROSCOPIC
Bilirubin Urine: NEGATIVE
Leukocytes, UA: NEGATIVE
Nitrite: NEGATIVE
Specific Gravity, Urine: 1.02 (ref 1.005–1.030)
pH: 5.5 (ref 5.0–8.0)

## 2011-08-29 LAB — URINE MICROSCOPIC-ADD ON

## 2011-08-29 LAB — GLUCOSE, CAPILLARY: Glucose-Capillary: 317 mg/dL — ABNORMAL HIGH (ref 70–99)

## 2011-08-29 MED ORDER — SODIUM CHLORIDE 0.9 % IV SOLN
Freq: Once | INTRAVENOUS | Status: AC
  Administered 2011-08-29: 13:00:00 via INTRAVENOUS

## 2011-08-29 MED ORDER — ONDANSETRON HCL 4 MG/2ML IJ SOLN
4.0000 mg | Freq: Once | INTRAMUSCULAR | Status: AC
  Administered 2011-08-29: 4 mg via INTRAVENOUS
  Filled 2011-08-29: qty 2

## 2011-08-29 MED ORDER — KETOROLAC TROMETHAMINE 30 MG/ML IJ SOLN
30.0000 mg | Freq: Once | INTRAMUSCULAR | Status: AC
  Administered 2011-08-29: 30 mg via INTRAVENOUS
  Filled 2011-08-29: qty 1

## 2011-08-29 MED ORDER — PROMETHAZINE HCL 25 MG PO TABS: 25.0000 mg | ORAL_TABLET | Freq: Four times a day (QID) | ORAL | Status: AC | PRN

## 2011-08-29 NOTE — Discharge Instructions (Signed)
Viral Gastroenteritis Viral gastroenteritis is also known as stomach flu. This condition affects the stomach and intestinal tract. It can cause sudden diarrhea and vomiting. The illness typically lasts 3 to 8 days. Most people develop an immune response that eventually gets rid of the virus. While this natural response develops, the virus can make you quite ill. CAUSES  Many different viruses can cause gastroenteritis, such as rotavirus or noroviruses. You can catch one of these viruses by consuming contaminated food or water. You may also catch a virus by sharing utensils or other personal items with an infected person or by touching a contaminated surface. SYMPTOMS  The most common symptoms are diarrhea and vomiting. These problems can cause a severe loss of body fluids (dehydration) and a body salt (electrolyte) imbalance. Other symptoms may include:  Fever.   Headache.   Fatigue.   Abdominal pain.  DIAGNOSIS  Your caregiver can usually diagnose viral gastroenteritis based on your symptoms and a physical exam. A stool sample may also be taken to test for the presence of viruses or other infections. TREATMENT  This illness typically goes away on its own. Treatments are aimed at rehydration. The most serious cases of viral gastroenteritis involve vomiting so severely that you are not able to keep fluids down. In these cases, fluids must be given through an intravenous line (IV). HOME CARE INSTRUCTIONS   Drink enough fluids to keep your urine clear or pale yellow. Drink small amounts of fluids frequently and increase the amounts as tolerated.   Ask your caregiver for specific rehydration instructions.   Avoid:   Foods high in sugar.   Alcohol.   Carbonated drinks.   Tobacco.   Juice.   Caffeine drinks.   Extremely hot or cold fluids.   Fatty, greasy foods.   Too much intake of anything at one time.   Dairy products until 24 to 48 hours after diarrhea stops.   You may  consume probiotics. Probiotics are active cultures of beneficial bacteria. They may lessen the amount and number of diarrheal stools in adults. Probiotics can be found in yogurt with active cultures and in supplements.   Wash your hands well to avoid spreading the virus.   Only take over-the-counter or prescription medicines for pain, discomfort, or fever as directed by your caregiver. Do not give aspirin to children. Antidiarrheal medicines are not recommended.   Ask your caregiver if you should continue to take your regular prescribed and over-the-counter medicines.   Keep all follow-up appointments as directed by your caregiver.  SEEK IMMEDIATE MEDICAL CARE IF:   You are unable to keep fluids down.   You do not urinate at least once every 6 to 8 hours.   You develop shortness of breath.   You notice blood in your stool or vomit. This may look like coffee grounds.   You have abdominal pain that increases or is concentrated in one small area (localized).   You have persistent vomiting or diarrhea.   You have a fever.   The patient is a child younger than 3 months, and he or she has a fever.   The patient is a child older than 3 months, and he or she has a fever and persistent symptoms.   The patient is a child older than 3 months, and he or she has a fever and symptoms suddenly get worse.   The patient is a baby, and he or she has no tears when crying.  MAKE SURE YOU:     Understand these instructions.   Will watch your condition.   Will get help right away if you are not doing well or get worse.  Document Released: 02/14/2005 Document Revised: 02/03/2011 Document Reviewed: 12/01/2010 [employer] Patient Information 2012 [employer]. 

## 2011-08-29 NOTE — ED Notes (Signed)
abd pain, nausea, vomited last pm.   Diarrhea x15, Took imodium and no diarrhea in last 4 hours.  Has red area on back of her neck that she wants checked.

## 2011-08-29 NOTE — ED Provider Notes (Signed)
History   This chart was scribed for Geoffery Lyons, MD by Sofie Rower. The patient was seen in room APA19/APA19 and the patient's care was started at 12:47 PM     CSN: [id number]  Arrival date & time 08/29/11  1157   First MD Initiated Contact with Patient 08/29/11 1246      No chief complaint on file.   (Consider location/radiation/quality/duration/timing/severity/associated sxs/prior treatment) HPI  Olivia Webb is a 31 y.o. female who presents to the Emergency Department complaining of moderate, episodic emesis onset yesterday with associated symptoms of nausea, diarrhea ( X 15), abdominal pain located at the epigastrium. The pt describes the abdominal pain as a churning sensation. 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   Pt denies dysuria.      Past Medical History  Diagnosis Date  . Diabetes mellitus     Past Surgical History  Procedure Date  . Cholecystectomy   . Abdominal hysterectomy     Family History  Problem Relation Age of Onset  . Diabetes Mother   . Heart failure Father   . Cancer Brother     History  Substance Use Topics  . Smoking status: Former Games developer  . Smokeless tobacco: Not on file  . Alcohol Use: No    OB History    Grav Para Term Preterm Abortions TAB SAB Ect Mult Living                  Review of Systems  All other systems reviewed and are negative.    10 Systems reviewed and all are negative for acute change except as noted in the HPI.    Allergies  Review of patient's allergies indicates no known allergies.  Home Medications   Current Outpatient Rx  Name Route Sig Dispense Refill  . DIPHENHYDRAMINE HCL 25 MG PO CAPS Oral Take 25 mg by mouth at bedtime as needed. She is allergic to cats and parents have one in household    . METFORMIN HCL 1000 MG PO TABS Oral Take 1,000 mg by mouth 2 (two) times daily with a meal.      BP 155/96  Pulse 89  Temp 98 F (36.7 C) (Oral)   Resp 20  Ht 5\' 10"  (1.778 m)  Wt 326 lb (147.873 kg)  BMI 46.78 kg/m2  SpO2 97%  LMP 11/04/2010  Physical Exam  Nursing note and vitals reviewed. Constitutional: She appears well-developed and well-nourished.  HENT:  Head: Atraumatic.  Right Ear: External ear normal.  Left Ear: External ear normal.  Nose: Nose normal.  Cardiovascular: Normal rate, regular rhythm and normal heart sounds.   Pulmonary/Chest: Effort normal and breath sounds normal. No respiratory distress. She has no wheezes.  Abdominal: Soft. There is tenderness (Mild epigastric tenderness. ). There is no rebound and no guarding.  Musculoskeletal: Normal range of motion. She exhibits no tenderness.       No CVA tenderness.   Neurological: She is alert.  Skin: Skin is warm and dry.  Psychiatric: She has a normal mood and affect. Her behavior is normal.    ED Course  Procedures (including critical care time)  DIAGNOSTIC STUDIES: Oxygen Saturation is 97% on room air, normal by my interpretation.    COORDINATION OF CARE:    12:51PM- EDP at bedside discusses treatment plan concerning IV fluids, nausea management, urine sample.   Labs Reviewed  GLUCOSE, CAPILLARY - Abnormal; Notable for the following:  Glucose-Capillary 317 (*)     All other components within normal limits  URINALYSIS, ROUTINE W REFLEX MICROSCOPIC   No results found.   No diagnosis found.    MDM  Presentation, labs, exam all consistent with gastroenteritis.  Will discharge to home with phenergan, return prn if worsens.      I personally performed the services described in this documentation, which was scribed in my presence. The recorded information has been reviewed and considered.      Geoffery Lyons, MD 08/29/11 [phone]

## 2011-09-30 ENCOUNTER — Emergency Department (HOSPITAL_COMMUNITY)
Admission: EM | Admit: 2011-09-30 | Discharge: 2011-09-30 | Disposition: A | Discharge status: Home / Self Care | Payer: No Typology Code available for payment source | Attending: Emergency Medicine | Admitting: Emergency Medicine

## 2011-09-30 ENCOUNTER — Encounter (HOSPITAL_COMMUNITY): Payer: Self-pay | Admitting: *Deleted

## 2011-09-30 DIAGNOSIS — R51 Headache: Secondary | ICD-10-CM | POA: Insufficient documentation

## 2011-09-30 DIAGNOSIS — E119 Type 2 diabetes mellitus without complications: Secondary | ICD-10-CM | POA: Insufficient documentation

## 2011-09-30 DIAGNOSIS — J329 Chronic sinusitis, unspecified: Secondary | ICD-10-CM

## 2011-09-30 DIAGNOSIS — Z87891 Personal history of nicotine dependence: Secondary | ICD-10-CM | POA: Insufficient documentation

## 2011-09-30 MED ORDER — FLUTICASONE PROPIONATE 50 MCG/ACT NA SUSP: 1.0000 | Freq: Every day | NASAL | Status: DC

## 2011-09-30 NOTE — ED Notes (Signed)
Headache, bil ear pain,  Facial pain. Sore throat.

## 2011-09-30 NOTE — ED Provider Notes (Signed)
History   This chart was scribed for Joya Gaskins, MD by Charolett Bumpers . The patient was seen in room APFT20/APFT20. Patient's care was started at 1348.    CSN: [id number]  Arrival date & time 09/30/11  1319   First MD Initiated Contact with Patient 09/30/11 1348      Chief Complaint  Patient presents with  . Headache     HPI Olivia Webb is a 31 y.o. female who presents to the Emergency Department complaining of constant, moderate frontal headache for the past 4 days. Pt reports associated bilaterally ear fullness, sore throat and post nasal drip. Pt also reports some frontal sinus pressure that started 2 days ago. Pt denies any fevers, vomiting, numbness. Pt reports a h/o diabetes and states that her blood sugar is normally 150 after eating. Pt denies any h/o HTN, and states that she is otherwise healthy.   Past Medical History  Diagnosis Date  . Diabetes mellitus     Past Surgical History  Procedure Date  . Cholecystectomy   . Abdominal hysterectomy     Family History  Problem Relation Age of Onset  . Diabetes Mother   . Heart failure Father   . Cancer Brother     History  Substance Use Topics  . Smoking status: Former Games developer  . Smokeless tobacco: Not on file  . Alcohol Use: No    OB History    Grav Para Term Preterm Abortions TAB SAB Ect Mult Living                  Review of Systems  Constitutional: Negative for fever.  HENT: Positive for ear pain, sore throat, postnasal drip and sinus pressure.   Eyes: Negative for visual disturbance.  Gastrointestinal: Negative for vomiting.  Neurological: Positive for headaches. Negative for numbness.  All other systems reviewed and are negative.    Allergies  Review of patient's allergies indicates no known allergies.  Home Medications   Current Outpatient Rx  Name Route Sig Dispense Refill  . DIPHENHYDRAMINE HCL 25 MG PO CAPS Oral Take 25 mg by mouth at bedtime as needed. She is allergic to  cats and parents have one in household    . METFORMIN HCL 1000 MG PO TABS Oral Take 1,000 mg by mouth 2 (two) times daily with a meal.      BP 136/95  Pulse 86  Temp 98.6 F (37 C)  Resp 20  Ht 5\' 10"  (1.778 m)  Wt 324 lb (146.965 kg)  BMI 46.49 kg/m2  SpO2 99%  LMP 11/04/2010  Physical Exam CONSTITUTIONAL: Well developed/well nourished HEAD AND FACE: Normocephalic/atraumatic EYES: EOMI/PERRL ENMT: Mucous membranes moist, poor dentition. No facial swelling.   Localizes tenderness to bilateral maxillary sinus, no bruising/erythema noted.  Bilateral TM clear and no erythema NECK: supple no meningeal signs CV: S1/S2 noted, no murmurs/rubs/gallops noted LUNGS: Lungs are clear to auscultation bilaterally, no apparent distress ABDOMEN: soft, nontender, no rebound or guarding GU:no cva tenderness NEURO:Awake/alert, facies symmetric, no arm or leg drift is noted Cranial nerves 3/4/5/6/09/05/08/11/12 tested and intact Gait normal EXTREMITIES: pulses normal, full ROM SKIN: warm, color normal PSYCH: no abnormalities of mood noted  ED Course  Procedures  DIAGNOSTIC STUDIES: Oxygen Saturation is 99% on room air, normal by my interpretation.    COORDINATION OF CARE:  14:12-Discussed planned course of treatment with the patient including OTC and nasal sprays, who is agreeable at this time. Discussed that an abx is not needed  at this time.      MDM  Nursing notes including past medical history and social history reviewed and considered in documentation    I personally performed the services described in this documentation, which was scribed in my presence. The recorded information has been reviewed and considered.         Joya Gaskins, MD 09/30/11 [phone]

## 2011-09-30 NOTE — ED Notes (Signed)
Pt states headache, sore throat, bilateral ear pain. Headache first began 3-4 days ago. Pt also states sinus pressure. NAD.

## 2011-10-09 ENCOUNTER — Emergency Department (HOSPITAL_COMMUNITY): Payer: No Typology Code available for payment source

## 2011-10-09 ENCOUNTER — Encounter (HOSPITAL_COMMUNITY): Payer: Self-pay | Admitting: *Deleted

## 2011-10-09 ENCOUNTER — Emergency Department (HOSPITAL_COMMUNITY)
Admission: EM | Admit: 2011-10-09 | Discharge: 2011-10-09 | Disposition: A | Discharge status: Home / Self Care | Payer: No Typology Code available for payment source | Attending: Emergency Medicine | Admitting: Emergency Medicine

## 2011-10-09 DIAGNOSIS — M5416 Radiculopathy, lumbar region: Secondary | ICD-10-CM

## 2011-10-09 DIAGNOSIS — I10 Essential (primary) hypertension: Secondary | ICD-10-CM | POA: Insufficient documentation

## 2011-10-09 DIAGNOSIS — E119 Type 2 diabetes mellitus without complications: Secondary | ICD-10-CM | POA: Insufficient documentation

## 2011-10-09 DIAGNOSIS — IMO0002 Reserved for concepts with insufficient information to code with codable children: Secondary | ICD-10-CM | POA: Insufficient documentation

## 2011-10-09 DIAGNOSIS — Z87891 Personal history of nicotine dependence: Secondary | ICD-10-CM | POA: Insufficient documentation

## 2011-10-09 MED ORDER — METHOCARBAMOL 500 MG PO TABS: ORAL_TABLET | ORAL | Status: DC

## 2011-10-09 MED ORDER — OXYCODONE-ACETAMINOPHEN 5-325 MG PO TABS: 1.0000 | ORAL_TABLET | ORAL | Status: AC | PRN

## 2011-10-09 MED ORDER — OXYCODONE-ACETAMINOPHEN 5-325 MG PO TABS
1.0000 | ORAL_TABLET | Freq: Once | ORAL | Status: AC
  Administered 2011-10-09: 1 via ORAL
  Filled 2011-10-09: qty 1

## 2011-10-09 NOTE — ED Notes (Addendum)
Pt states that she fell a week ago at work due to water in the floor, still continues to have problems with her lower back, pt states that the pain radiates down left leg, denies any problems with bowel or bladder function.  pt ambulatory to triage without any problems, pt states that she was seen [facility] on Sunday when she fell, has been to dr this past Wednesday, pt also states that her blood pressure was reading "high": on both visits. Denies any headache,

## 2011-10-09 NOTE — ED Notes (Signed)
Pt c/o lower back pain that radiates down left leg. Pt also c/o numbness and tingling on the left side of her back and down her left leg. Pt states pain is constant and worse with movement. Pain began about 1 week ago after pt slipped and fell at work.

## 2011-10-13 NOTE — ED Provider Notes (Signed)
History     CSN: [id number]  Arrival date & time 10/09/11  1115   First MD Initiated Contact with Patient 10/09/11 1139      Chief Complaint  Patient presents with  . Back Pain    (Consider location/radiation/quality/duration/timing/severity/associated sxs/prior treatment) HPI Comments: Patient c/o low back pain for more than one week . Pain began after a fall at work.  States she was seen in another ED at the time of the injury and has also followed up with her PMD.  Continues to have pain to her back that at times, radiates into her left leg.  Pain improves with certain positions and rest.  She denies urinary sx's, incontinence, numbness or weakness or abd pain.  States she has previously been told that her BP is elevated but states her PMD has not prescribed medication yet.  Patient is a 31 y.o. female presenting with back pain. The history is provided by the patient.  Back Pain  This is a chronic problem. The current episode started more than 1 week ago. The problem occurs constantly. The problem has not changed since onset.The pain is associated with falling. The pain is present in the lumbar spine and sacro-iliac joint. The quality of the pain is described as aching. The pain radiates to the left thigh. The pain is moderate. The symptoms are aggravated by bending and certain positions. Associated symptoms include leg pain. Pertinent negatives include no chest pain, no fever, no numbness, no headaches, no abdominal pain, no abdominal swelling, no bowel incontinence, no perianal numbness, no bladder incontinence, no dysuria, no pelvic pain, no paresthesias, no paresis, no tingling and no weakness. She has tried analgesics for the symptoms. The treatment provided mild relief. Risk factors include obesity.    Past Medical History  Diagnosis Date  . Diabetes mellitus     Past Surgical History  Procedure Date  . Cholecystectomy   . Abdominal hysterectomy     Family History  Problem  Relation Age of Onset  . Diabetes Mother   . Heart failure Father   . Cancer Brother     History  Substance Use Topics  . Smoking status: Former Games developer  . Smokeless tobacco: Not on file  . Alcohol Use: No    OB History    Grav Para Term Preterm Abortions TAB SAB Ect Mult Living                  Review of Systems  Constitutional: Negative for fever.  Respiratory: Negative for shortness of breath.   Cardiovascular: Negative for chest pain.  Gastrointestinal: Negative for vomiting, abdominal pain, constipation and bowel incontinence.  Genitourinary: Negative for bladder incontinence, dysuria, hematuria, flank pain, decreased urine volume, vaginal bleeding, difficulty urinating and pelvic pain.       Perineal numbness or incontinence of urine or feces  Musculoskeletal: Positive for back pain. Negative for joint swelling.  Skin: Negative for rash.  Neurological: Negative for dizziness, tingling, speech difficulty, weakness, numbness, headaches and paresthesias.  All other systems reviewed and are negative.    Allergies  Review of patient's allergies indicates no known allergies.  Home Medications   Current Outpatient Rx  Name Route Sig Dispense Refill  . DIPHENHYDRAMINE HCL 25 MG PO CAPS Oral Take 25 mg by mouth at bedtime as needed. She is allergic to cats and parents have one in household    . FLUTICASONE PROPIONATE 50 MCG/ACT NA SUSP Nasal Place 1 spray into the nose daily. 16 g  0  . METFORMIN HCL 1000 MG PO TABS Oral Take 1,000 mg by mouth 2 (two) times daily with a meal.    . METHOCARBAMOL 500 MG PO TABS  Two tabs po TID prn muscle spasms 42 tablet 0  . OXYCODONE-ACETAMINOPHEN 5-325 MG PO TABS Oral Take 1 tablet by mouth every 4 (four) hours as needed for pain. 20 tablet 0    BP 178/119  Pulse 76  Temp 98 F (36.7 C)  Resp 18  Ht 5\' 10"  (1.778 m)  Wt 326 lb (147.873 kg)  BMI 46.78 kg/m2  SpO2 94%  LMP 11/04/2010  Physical Exam  Nursing note and vitals  reviewed. Constitutional: She is oriented to person, place, and time. She appears well-developed and well-nourished. No distress.       Pt is morbidly obese  Neck: Normal range of motion. Neck supple.  Cardiovascular: Normal rate, regular rhythm and intact distal pulses.   No murmur heard. Pulmonary/Chest: Effort normal and breath sounds normal.  Musculoskeletal: Normal range of motion. She exhibits tenderness. She exhibits no edema.       Lumbar back: She exhibits tenderness, bony tenderness and pain. She exhibits normal range of motion, no swelling, no deformity, no laceration and normal pulse.       Back:  Neurological: She is alert and oriented to person, place, and time. No cranial nerve deficit or sensory deficit. She exhibits normal muscle tone. Coordination and gait normal.  Reflex Scores:      Patellar reflexes are 2+ on the right side and 2+ on the left side.      Achilles reflexes are 2+ on the right side and 2+ on the left side. Skin: Skin is warm and dry.    ED Course  Procedures (including critical care time)  Labs Reviewed - No data to display No results found.   1. Lumbar radiculopathy    Dg Lumbar Spine Complete  10/09/2011  *RADIOLOGY REPORT*  Clinical Data: Low back pain after a fall earlier today.  LUMBAR SPINE - COMPLETE 4+ VIEW  Comparison: Bone window images from CT abdomen and pelvis 01/06/2011. Prior chest x-ray 11/05/2010 and 09/02/2011 utilized count ribs.  Findings: Five non-rib bearing lumbar vertebrae, with L5 representing a transitional lumbosacral segment, having a well defined assimilation joint between its left transverse process and the first sacral segment.  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  Anatomic alignment.  No fractures.  Mild disc space narrowing at L4- 5.  Remaining disc spaces well preserved.  No pars defects.  No significant facet arthropathy.   Visualized sacroiliac joints intact.  No degenerative changes at the L5-S1 assimilation joint on the left.  IMPRESSION: Transitional L5 segment.  No acute osseous abnormality.  Mild disc space narrowing at L4-5.  Original Report Authenticated By: Arnell Sieving, M.D.     MDM    Patient has ttp of the lumbar paraspinal muscles.  No focal neuro deficits on exam.  Ambulates with a steady gait.      Pt has seen her PMD for same. Pt is hypertensive on this visit and reports hx of same. I have advised her to monitor her BP closely and she agrees to see her PMD this week for recheck regarding her BP.  She denies any neurological symptoms on this visit including headaches, dizziness or visual changes.   The patient appears reasonably screened and/or stabilized for discharge and I  doubt any other medical condition or other [facility] requiring further screening, evaluation, or treatment in the ED at this time prior to discharge.   Prescribed: Robaxin Percocet #[id number]   Fer[employer] L. Lillie, [place] 10/13/11 1756

## 2011-10-17 NOTE — ED Provider Notes (Signed)
Medical screening examination/treatment/procedure(s) were performed by non-physician practitioner and as supervising physician I was immediately available for consultation/collaboration.  Vollie Brunty, MD 10/17/11 1945 

## 2011-11-28 ENCOUNTER — Emergency Department (HOSPITAL_COMMUNITY)
Admission: EM | Admit: 2011-11-28 | Discharge: 2011-11-28 | Disposition: A | Discharge status: Home / Self Care | Payer: No Typology Code available for payment source | Attending: Emergency Medicine | Admitting: Emergency Medicine

## 2011-11-28 DIAGNOSIS — Z0389 Encounter for observation for other suspected diseases and conditions ruled out: Secondary | ICD-10-CM | POA: Insufficient documentation

## 2011-11-28 MED ORDER — ACETAMINOPHEN 500 MG PO TABS
ORAL_TABLET | ORAL | Status: AC
  Filled 2011-11-28: qty 2

## 2011-11-28 NOTE — ED Notes (Signed)
No answer when called 

## 2011-11-28 NOTE — ED Notes (Signed)
No answer

## 2012-02-17 ENCOUNTER — Encounter (HOSPITAL_COMMUNITY): Payer: Self-pay | Admitting: *Deleted

## 2012-02-17 ENCOUNTER — Emergency Department (HOSPITAL_COMMUNITY)
Admission: EM | Admit: 2012-02-17 | Discharge: 2012-02-17 | Discharge status: Against Medical Advice | Payer: Self-pay | Attending: Emergency Medicine | Admitting: Emergency Medicine

## 2012-02-17 DIAGNOSIS — R109 Unspecified abdominal pain: Secondary | ICD-10-CM | POA: Insufficient documentation

## 2012-02-17 DIAGNOSIS — M549 Dorsalgia, unspecified: Secondary | ICD-10-CM | POA: Insufficient documentation

## 2012-02-17 HISTORY — DX: Calculus of kidney: N20.0

## 2012-02-17 NOTE — ED Notes (Signed)
Pt called to treatment room 3rd time. No answer.

## 2012-02-17 NOTE — ED Notes (Signed)
No answer when pt called to treatment room. 

## 2012-02-17 NOTE — ED Notes (Signed)
Pt states left flank pain / back pain. Symptoms x ~ 4 days. Denies nausea or urinary symptoms.

## 2012-02-17 NOTE — ED Notes (Signed)
Called to treatment room 2nd time no answer.

## 2012-05-25 ENCOUNTER — Encounter (HOSPITAL_COMMUNITY): Payer: Self-pay | Admitting: *Deleted

## 2012-05-25 ENCOUNTER — Emergency Department (HOSPITAL_COMMUNITY)
Admission: EM | Admit: 2012-05-25 | Discharge: 2012-05-25 | Disposition: A | Discharge status: Home / Self Care | Payer: Self-pay | Attending: Emergency Medicine | Admitting: Emergency Medicine

## 2012-05-25 DIAGNOSIS — S39012A Strain of muscle, fascia and tendon of lower back, initial encounter: Secondary | ICD-10-CM

## 2012-05-25 DIAGNOSIS — E119 Type 2 diabetes mellitus without complications: Secondary | ICD-10-CM | POA: Insufficient documentation

## 2012-05-25 DIAGNOSIS — Y929 Unspecified place or not applicable: Secondary | ICD-10-CM | POA: Insufficient documentation

## 2012-05-25 DIAGNOSIS — IMO0002 Reserved for concepts with insufficient information to code with codable children: Secondary | ICD-10-CM | POA: Insufficient documentation

## 2012-05-25 DIAGNOSIS — S335XXA Sprain of ligaments of lumbar spine, initial encounter: Secondary | ICD-10-CM | POA: Insufficient documentation

## 2012-05-25 DIAGNOSIS — X58XXXA Exposure to other specified factors, initial encounter: Secondary | ICD-10-CM | POA: Insufficient documentation

## 2012-05-25 DIAGNOSIS — Z79899 Other long term (current) drug therapy: Secondary | ICD-10-CM | POA: Insufficient documentation

## 2012-05-25 DIAGNOSIS — Y939 Activity, unspecified: Secondary | ICD-10-CM | POA: Insufficient documentation

## 2012-05-25 DIAGNOSIS — Z87442 Personal history of urinary calculi: Secondary | ICD-10-CM | POA: Insufficient documentation

## 2012-05-25 DIAGNOSIS — Z87891 Personal history of nicotine dependence: Secondary | ICD-10-CM | POA: Insufficient documentation

## 2012-05-25 MED ORDER — BACLOFEN 10 MG PO TABS: 10.0000 mg | ORAL_TABLET | Freq: Three times a day (TID) | ORAL | Status: AC

## 2012-05-25 NOTE — ED Provider Notes (Signed)
History     CSN: [id number]  Arrival date & time 05/25/12  1315   First MD Initiated Contact with Patient 05/25/12 1355      Chief Complaint  Patient presents with  . Back Pain    (Consider location/radiation/quality/duration/timing/severity/associated sxs/prior treatment) Patient is a 32 y.o. female presenting with back pain. The history is provided by the patient.  Back Pain Location:  Lumbar spine Quality:  Aching and stiffness Stiffness is present:  All day Radiates to: left scapula. Pain severity:  Moderate Pain is:  Same all the time Onset quality:  Gradual Timing:  Intermittent Progression:  Worsening Chronicity:  Chronic Context: not falling, not MCA and not MVA   Relieved by:  Nothing Worsened by:  Nothing tried Ineffective treatments:  None tried Associated symptoms: no abdominal pain, no bladder incontinence, no bowel incontinence, no chest pain, no dysuria and no perianal numbness   Risk factors: no recent surgery     Past Medical History  Diagnosis Date  . Diabetes mellitus   . Kidney stones     Past Surgical History  Procedure Laterality Date  . Cholecystectomy    . Abdominal hysterectomy      Family History  Problem Relation Age of Onset  . Diabetes Mother   . Heart failure Father   . Cancer Brother     History  Substance Use Topics  . Smoking status: Former Games developer  . Smokeless tobacco: Not on file  . Alcohol Use: No    OB History   Grav Para Term Preterm Abortions TAB SAB Ect Mult Living                  Review of Systems  Constitutional: Negative for activity change.       All ROS Neg except as noted in HPI  HENT: Negative for nosebleeds and neck pain.   Eyes: Negative for photophobia and discharge.  Respiratory: Negative for cough, shortness of breath and wheezing.   Cardiovascular: Negative for chest pain and palpitations.  Gastrointestinal: Negative for abdominal pain, blood in stool and bowel incontinence.  Genitourinary:  Positive for flank pain. Negative for bladder incontinence, dysuria, frequency and hematuria.  Musculoskeletal: Positive for back pain. Negative for arthralgias.  Skin: Negative.   Neurological: Negative for dizziness, seizures and speech difficulty.  Psychiatric/Behavioral: Negative for hallucinations and confusion.    Allergies  Review of patient's allergies indicates no known allergies.  Home Medications   Current Outpatient Rx  Name  Route  Sig  Dispense  Refill  . diphenhydrAMINE (BENADRYL) 25 mg capsule   Oral   Take 25 mg by mouth at bedtime as needed. She is allergic to cats and parents have one in household         . metFORMIN (GLUCOPHAGE) 1000 MG tablet   Oral   Take 1,000 mg by mouth 2 (two) times daily with a meal.         . fluticasone (FLONASE) 50 MCG/ACT nasal spray   Nasal   Place 1 spray into the nose daily.   16 g   0     BP 145/93  Pulse 86  Temp(Src) 98.3 F (36.8 C) (Oral)  Resp 20  Ht 5\' 10"  (1.778 m)  Wt 325 lb (147.419 kg)  BMI 46.63 kg/m2  SpO2 98%  LMP 11/04/2010  Physical Exam  Nursing note and vitals reviewed. Constitutional: She is oriented to person, place, and time. She appears well-developed and well-nourished.  Non-toxic appearance.  HENT:  Head: Normocephalic.  Right Ear: Tympanic membrane and external ear normal.  Left Ear: Tympanic membrane and external ear normal.  Eyes: EOM and lids are normal. Pupils are equal, round, and reactive to light.  Neck: Normal range of motion. Neck supple. Carotid bruit is not present.  Cardiovascular: Normal rate, regular rhythm, normal heart sounds, intact distal pulses and normal pulses.   Pulmonary/Chest: Breath sounds normal. No respiratory distress.  Abdominal: Soft. Bowel sounds are normal. There is no tenderness. There is no guarding.  Musculoskeletal: Normal range of motion.       Back:  Lymphadenopathy:       Head (right side): No submandibular adenopathy present.       Head  (left side): No submandibular adenopathy present.    She has no cervical adenopathy.  Neurological: She is alert and oriented to person, place, and time. She has normal strength. No cranial nerve deficit or sensory deficit.  Skin: Skin is warm and dry.  Psychiatric: She has a normal mood and affect. Her speech is normal.    ED Course  Procedures (including critical care time)  Labs Reviewed - No data to display No results found. Pulse ox 98% on RA. WNL by my interpretation.  No diagnosis found.    MDM  I have reviewed nursing notes, vital signs, and all appropriate lab and imaging results for this patient. Pt has lumbar area back pain, No new changes or problem. Gait intact. Plan- Rx for baclofen tid given. Pt referred to orthopedics.       Kathie Dike, PA-C 05/29/12 [phone]

## 2012-05-25 NOTE — ED Notes (Signed)
Lt side low back pain that at times radiates to scapula,  No recent injury.

## 2012-05-30 NOTE — ED Provider Notes (Signed)
Medical screening examination/treatment/procedure(s) were performed by non-physician practitioner and as supervising physician I was immediately available for consultation/collaboration.   Ishmael Berkovich M Ebert Forrester, DO 05/30/12 1653 

## 2012-08-24 IMAGING — CR DG LUMBAR SPINE COMPLETE 4+V
5 series · 5 of 5 positions shown · non-contrast
Comparison: Bone window images from CT abdomen and pelvis
01/06/2011. Prior chest x-ray 11/05/2010 and 09/02/2011 utilized
count ribs.

CLINICAL DATA: Low back pain after a fall earlier today.

LUMBAR SPINE - COMPLETE 4+ VIEW

[view not recorded (1 of 5)]
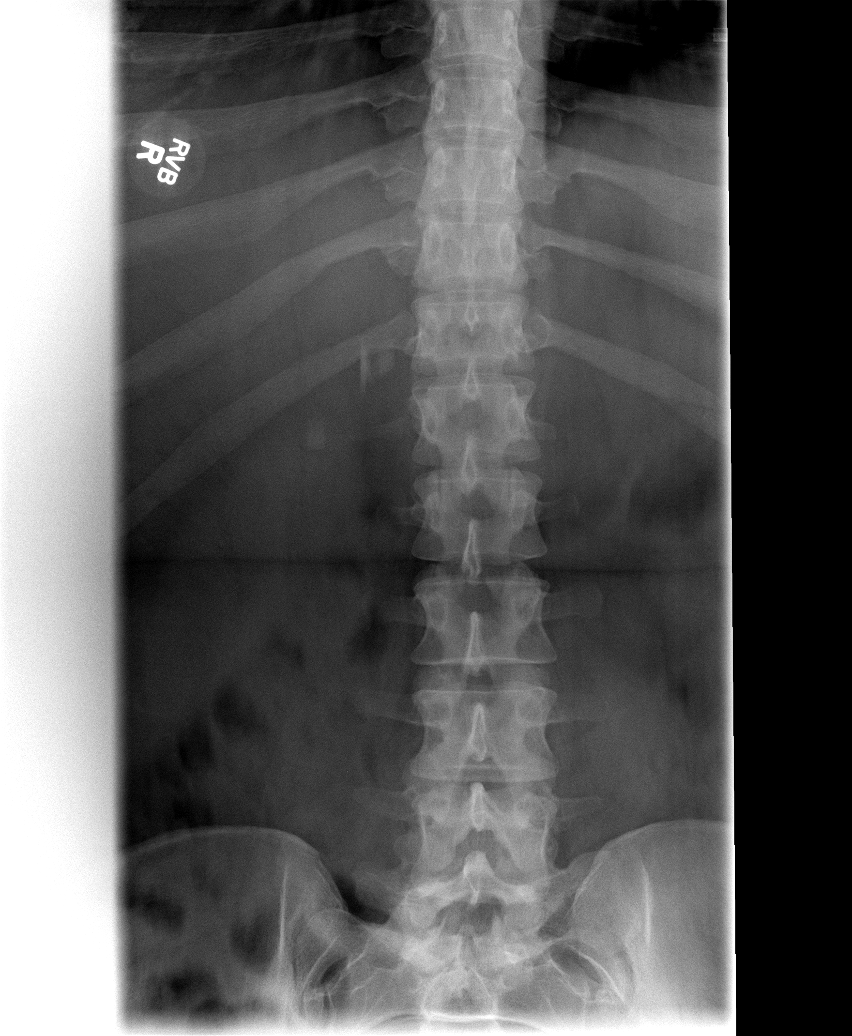

[view not recorded (2 of 5)]
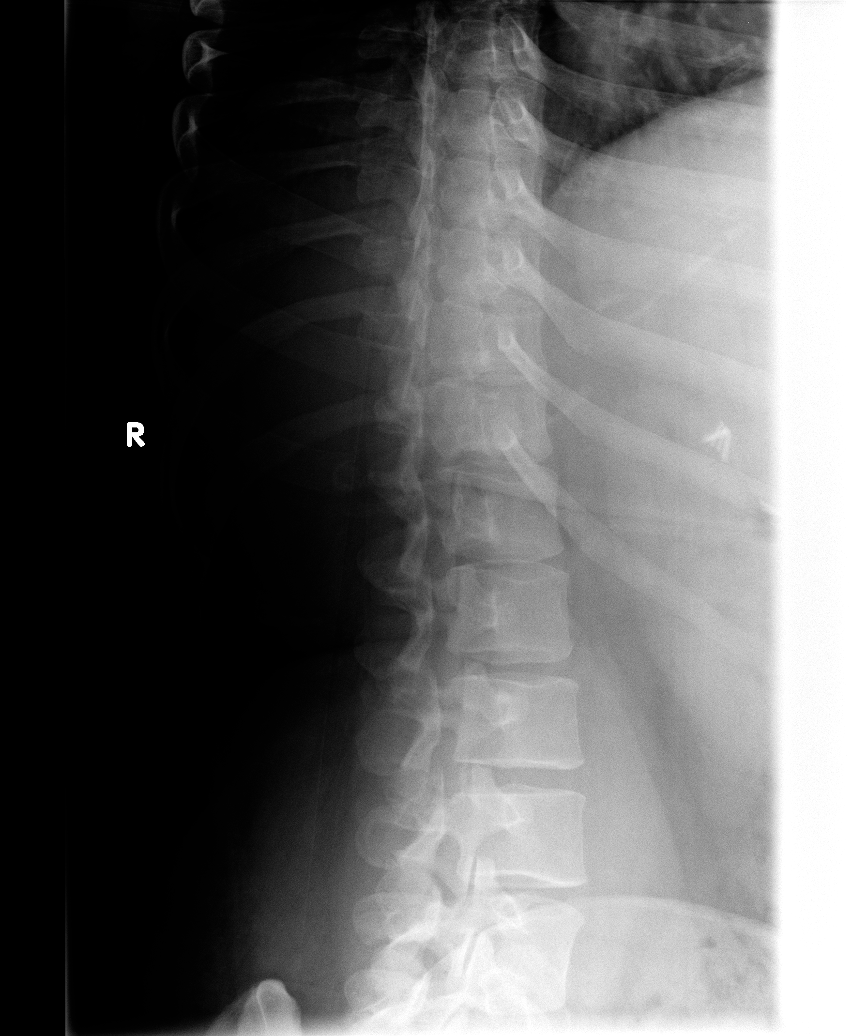

[view not recorded (3 of 5)]
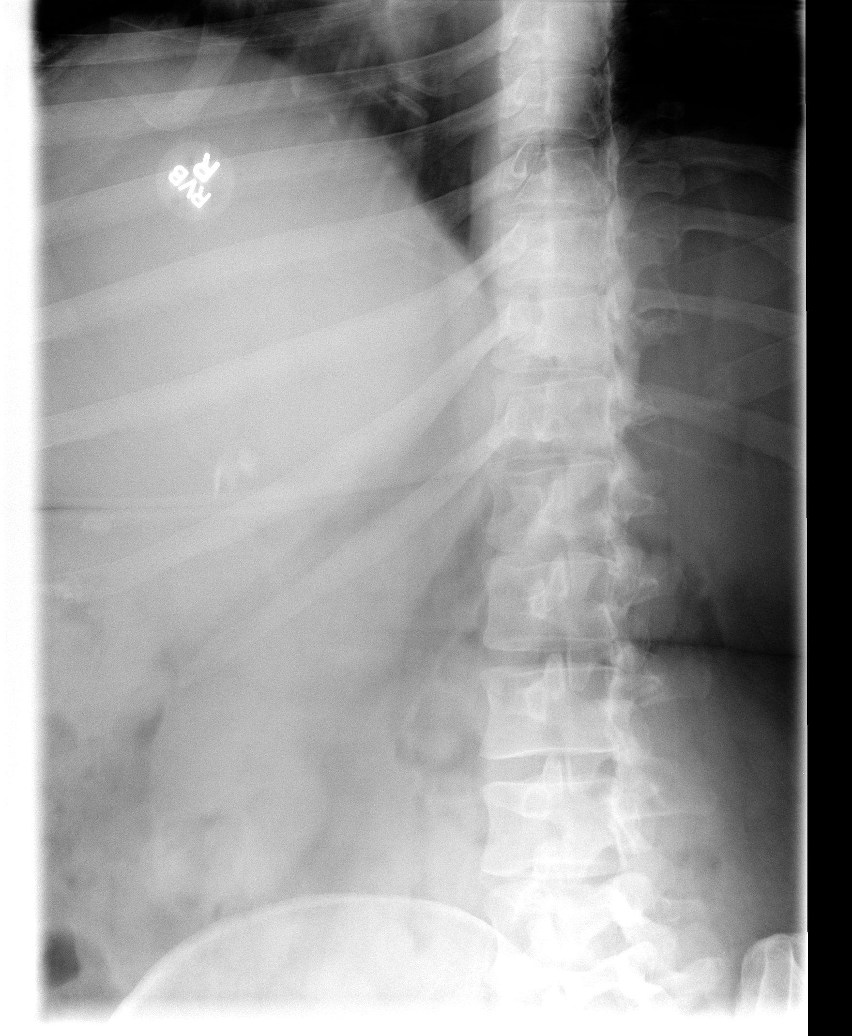

[view not recorded (4 of 5)]
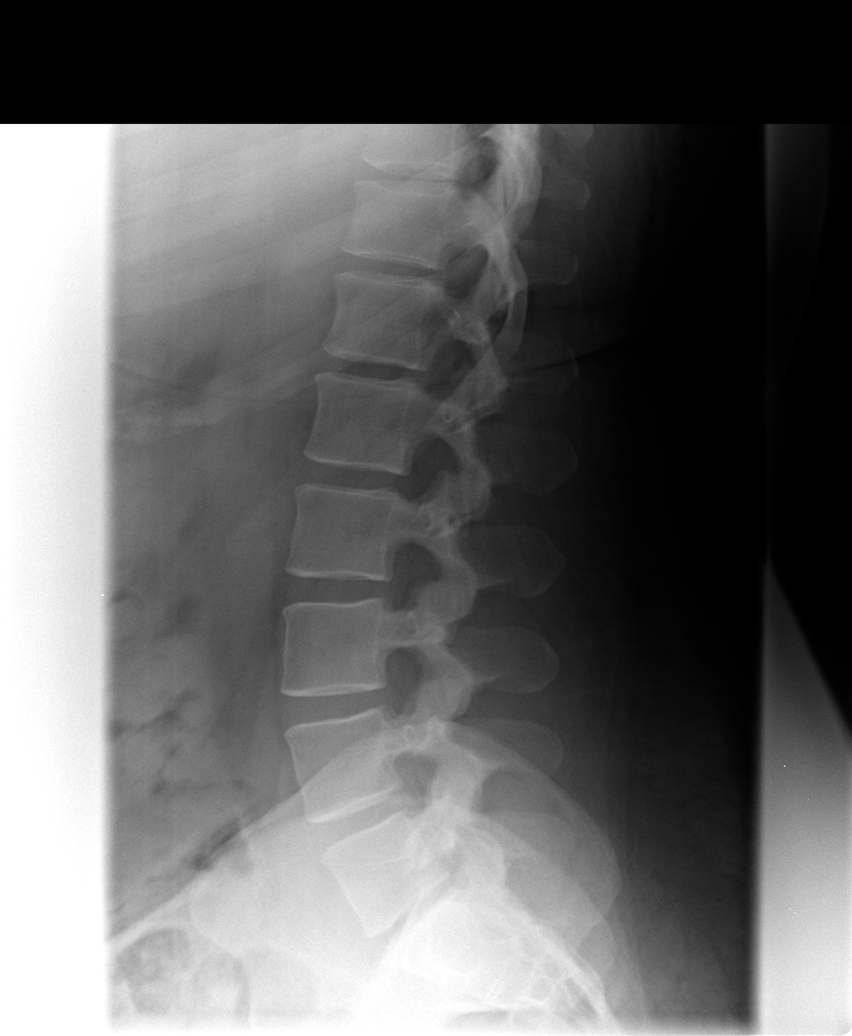

[view not recorded (5 of 5)]
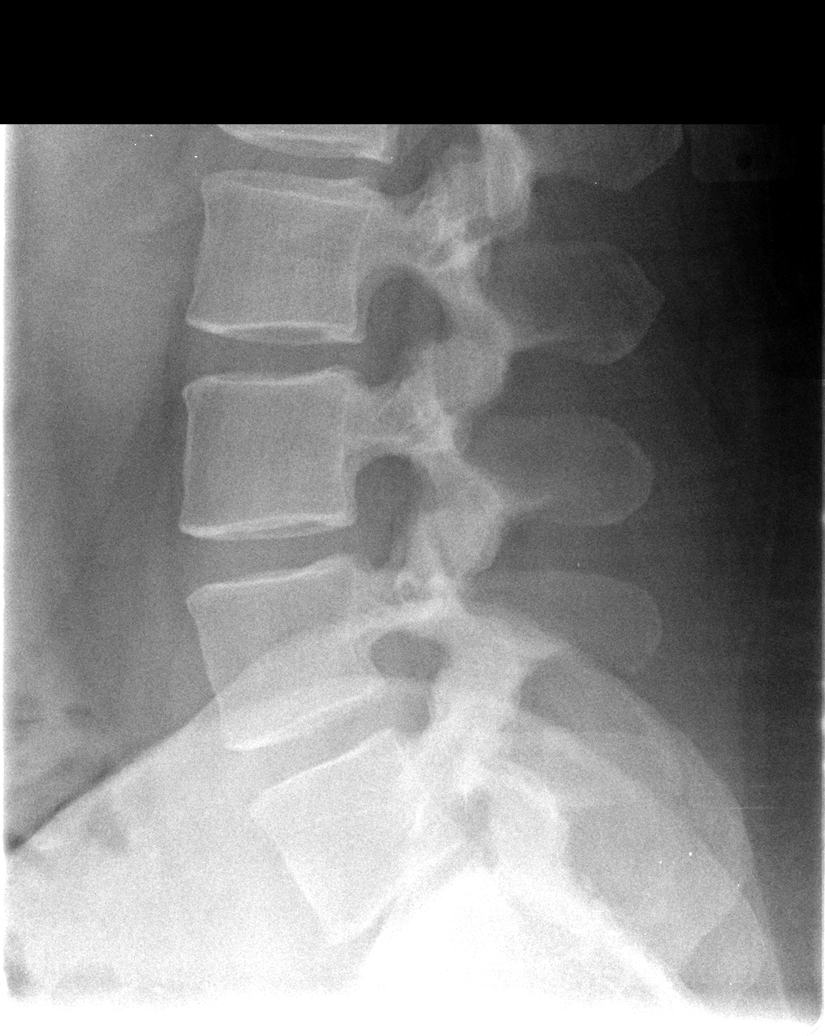

[5 of 5 positions shown; findings below may reference images not displayed]

FINDINGS: Five non-rib bearing lumbar vertebrae, with L5
representing a transitional lumbosacral segment, having a well
defined assimilation joint between its left transverse process and
the first sacral segment.  T12 has small, rudimentary ribs (prior
chest x-rays demonstrated 11 thoracic vertebrae with long ribs,
confirming T12 with small ribs).

Anatomic alignment.  No fractures.  Mild disc space narrowing at L4-
5.  Remaining disc spaces well preserved.  No pars defects.  No
significant facet arthropathy.  Visualized sacroiliac joints
intact.  No degenerative changes at the L5-S1 assimilation joint on
the left.
IMPRESSION: Transitional L5 segment.  No acute osseous abnormality.  Mild disc
space narrowing at L4-5.

## 2012-11-10 ENCOUNTER — Encounter (HOSPITAL_COMMUNITY): Payer: Self-pay | Admitting: *Deleted

## 2012-11-10 ENCOUNTER — Emergency Department (HOSPITAL_COMMUNITY)
Admission: EM | Admit: 2012-11-10 | Discharge: 2012-11-10 | Disposition: A | Discharge status: Home / Self Care | Payer: No Typology Code available for payment source | Attending: Emergency Medicine | Admitting: Emergency Medicine

## 2012-11-10 DIAGNOSIS — Y929 Unspecified place or not applicable: Secondary | ICD-10-CM | POA: Insufficient documentation

## 2012-11-10 DIAGNOSIS — IMO0002 Reserved for concepts with insufficient information to code with codable children: Secondary | ICD-10-CM | POA: Insufficient documentation

## 2012-11-10 DIAGNOSIS — X58XXXA Exposure to other specified factors, initial encounter: Secondary | ICD-10-CM | POA: Insufficient documentation

## 2012-11-10 DIAGNOSIS — Y990 Civilian activity done for income or pay: Secondary | ICD-10-CM | POA: Insufficient documentation

## 2012-11-10 DIAGNOSIS — E119 Type 2 diabetes mellitus without complications: Secondary | ICD-10-CM | POA: Insufficient documentation

## 2012-11-10 DIAGNOSIS — Z9089 Acquired absence of other organs: Secondary | ICD-10-CM | POA: Insufficient documentation

## 2012-11-10 DIAGNOSIS — Z87442 Personal history of urinary calculi: Secondary | ICD-10-CM | POA: Insufficient documentation

## 2012-11-10 DIAGNOSIS — Z87891 Personal history of nicotine dependence: Secondary | ICD-10-CM | POA: Insufficient documentation

## 2012-11-10 DIAGNOSIS — S161XXA Strain of muscle, fascia and tendon at neck level, initial encounter: Secondary | ICD-10-CM

## 2012-11-10 DIAGNOSIS — Y9389 Activity, other specified: Secondary | ICD-10-CM | POA: Insufficient documentation

## 2012-11-10 DIAGNOSIS — Z90710 Acquired absence of both cervix and uterus: Secondary | ICD-10-CM | POA: Insufficient documentation

## 2012-11-10 DIAGNOSIS — S0990XA Unspecified injury of head, initial encounter: Secondary | ICD-10-CM | POA: Insufficient documentation

## 2012-11-10 DIAGNOSIS — S139XXA Sprain of joints and ligaments of unspecified parts of neck, initial encounter: Secondary | ICD-10-CM | POA: Insufficient documentation

## 2012-11-10 MED ORDER — METHOCARBAMOL 500 MG PO TABS: 500.0000 mg | ORAL_TABLET | Freq: Three times a day (TID) | ORAL | Status: DC

## 2012-11-10 MED ORDER — HYDROCODONE-ACETAMINOPHEN 5-325 MG PO TABS
1.0000 | ORAL_TABLET | Freq: Once | ORAL | Status: AC
  Administered 2012-11-10: 1 via ORAL
  Filled 2012-11-10: qty 1

## 2012-11-10 MED ORDER — HYDROCODONE-ACETAMINOPHEN 5-325 MG PO TABS: 1.0000 | ORAL_TABLET | ORAL | Status: DC | PRN

## 2012-11-10 MED ORDER — METHOCARBAMOL 500 MG PO TABS
1000.0000 mg | ORAL_TABLET | Freq: Once | ORAL | Status: AC
  Administered 2012-11-10: 1000 mg via ORAL
  Filled 2012-11-10: qty 2

## 2012-11-10 MED ORDER — ONDANSETRON HCL 4 MG PO TABS
4.0000 mg | ORAL_TABLET | Freq: Once | ORAL | Status: AC
  Administered 2012-11-10: 4 mg via ORAL
  Filled 2012-11-10: qty 1

## 2012-11-10 MED ORDER — ONDANSETRON HCL 4 MG PO TABS: 4.0000 mg | ORAL_TABLET | Freq: Four times a day (QID) | ORAL | Status: DC

## 2012-11-10 NOTE — ED Notes (Signed)
Wednesday night began having dull ache in L side of neck while working on computer.  Next AM, it was more intense w/HA.  Nausea association w/pain.

## 2012-11-10 NOTE — ED Notes (Signed)
Pt states she has pain in left/posterior side of her neck since Wednesday night when she was working on her computer.  Thursday morning she woke up and the pain was worse.  According to pt, pain is 7/10 and she is currently not experiencing any nausea and denies vomiting and diarrhea.

## 2012-11-10 NOTE — ED Provider Notes (Signed)
Medical screening examination/treatment/procedure(s) were performed by non-physician practitioner and as supervising physician I was immediately available for consultation/collaboration.   Vickie Melnik T Jacarius Handel, MD 11/10/12 1801 

## 2012-11-10 NOTE — ED Provider Notes (Signed)
CSN: [id number]     Arrival date & time 11/10/12  1523 History   First MD Initiated Contact with Patient 11/10/12 1610     Chief Complaint  Patient presents with  . Neck Pain  . Headache   (Consider location/radiation/quality/duration/timing/severity/associated sxs/prior Treatment) Patient is a 32 y.o. female presenting with neck pain and headaches. The history is provided by the patient.  Neck Pain Pain location:  L side Quality:  Aching and cramping Pain radiates to:  L shoulder (left side of head) Pain severity:  Moderate Pain is:  Same all the time Onset quality:  Gradual Timing:  Intermittent Progression:  Worsening Chronicity:  New Context comment:  Neck pain after working on a computer Relieved by:  Nothing Ineffective treatments: icy-hot. Associated symptoms: headaches   Associated symptoms: no bladder incontinence, no bowel incontinence, no chest pain, no numbness, no photophobia and no weakness   Headache Associated symptoms: neck pain   Associated symptoms: no abdominal pain, no back pain, no cough, no dizziness, no numbness, no photophobia and no seizures     Past Medical History  Diagnosis Date  . Diabetes mellitus   . Kidney stones    Past Surgical History  Procedure Laterality Date  . Cholecystectomy    . Abdominal hysterectomy     Family History  Problem Relation Age of Onset  . Diabetes Mother   . Heart failure Father   . Cancer Brother    History  Substance Use Topics  . Smoking status: Former Games developer  . Smokeless tobacco: Not on file  . Alcohol Use: No   OB History   Grav Para Term Preterm Abortions TAB SAB Ect Mult Living                 Review of Systems  Constitutional: Negative for activity change.       All ROS Neg except as noted in HPI  HENT: Positive for neck pain. Negative for nosebleeds.   Eyes: Negative for photophobia and discharge.  Respiratory: Negative for cough, shortness of breath and wheezing.   Cardiovascular:  Negative for chest pain and palpitations.  Gastrointestinal: Negative for abdominal pain, blood in stool and bowel incontinence.  Genitourinary: Negative for bladder incontinence, dysuria, frequency and hematuria.  Musculoskeletal: Negative for back pain and arthralgias.  Skin: Negative.   Neurological: Positive for headaches. Negative for dizziness, seizures, speech difficulty, weakness and numbness.  Psychiatric/Behavioral: Negative for hallucinations and confusion.    Allergies  Review of patient's allergies indicates no known allergies.  Home Medications   Current Outpatient Rx  Name  Route  Sig  Dispense  Refill  . diphenhydrAMINE (BENADRYL) 25 mg capsule   Oral   Take 25 mg by mouth at bedtime as needed. She is allergic to cats and parents have one in household         . EXPIRED: fluticasone (FLONASE) 50 MCG/ACT nasal spray   Nasal   Place 1 spray into the nose daily.   16 g   0    BP 156/108  Pulse 91  Temp(Src) 98.3 F (36.8 C) (Oral)  Resp 16  Ht 5\' 10"  (1.778 m)  Wt 326 lb (147.873 kg)  BMI 46.78 kg/m2  LMP 11/04/2010 Physical Exam  Nursing note and vitals reviewed. Constitutional: She is oriented to person, place, and time. She appears well-developed and well-nourished.  Non-toxic appearance.  HENT:  Head: Normocephalic.  Right Ear: Tympanic membrane and external ear normal.  Left Ear: Tympanic membrane and external  ear normal.  Eyes: EOM and lids are normal. Pupils are equal, round, and reactive to light.  Neck: Normal range of motion. Neck supple. Carotid bruit is not present.  Soreness with ROM of the neck. No hot areas. No carotid bruits with stethoscope   Cardiovascular: Normal rate, regular rhythm, normal heart sounds, intact distal pulses and normal pulses.   Pulmonary/Chest: Breath sounds normal. No respiratory distress.  Abdominal: Soft. Bowel sounds are normal. There is no tenderness. There is no guarding.  Musculoskeletal: Normal range of  motion.  No step off of the c spine. Tightness and tenseness of the upper trapezious.  Lymphadenopathy:       Head (right side): No submandibular adenopathy present.       Head (left side): No submandibular adenopathy present.    She has no cervical adenopathy.  Neurological: She is alert and oriented to person, place, and time. She has normal strength. No cranial nerve deficit or sensory deficit.  Skin: Skin is warm and dry.  Psychiatric: She has a normal mood and affect. Her speech is normal.    ED Course  Procedures (including critical care time) Labs Review Labs Reviewed - No data to display Imaging Review No results found.  MDM  No diagnosis found. *I have reviewed nursing notes, vital signs, and all appropriate lab and imaging results for this patient.**  Pt was working on the computer when she developed left neck pain. No recent injury or operation to the neck. Plan - apply heat. Rx for norco and robaxin given to the patient. Pt will follow up with PCP on Thursday 9/18.  Kathie Dike, PA-C 11/10/12 1635

## 2013-08-01 ENCOUNTER — Emergency Department (HOSPITAL_COMMUNITY): Payer: BC Managed Care – PPO

## 2013-08-01 ENCOUNTER — Emergency Department (HOSPITAL_COMMUNITY)
Admission: EM | Admit: 2013-08-01 | Discharge: 2013-08-01 | Disposition: A | Discharge status: Home / Self Care | Payer: BC Managed Care – PPO | Attending: Emergency Medicine | Admitting: Emergency Medicine

## 2013-08-01 ENCOUNTER — Encounter (HOSPITAL_COMMUNITY): Payer: Self-pay | Admitting: Emergency Medicine

## 2013-08-01 DIAGNOSIS — Z90710 Acquired absence of both cervix and uterus: Secondary | ICD-10-CM | POA: Insufficient documentation

## 2013-08-01 DIAGNOSIS — Z9089 Acquired absence of other organs: Secondary | ICD-10-CM | POA: Insufficient documentation

## 2013-08-01 DIAGNOSIS — Z79899 Other long term (current) drug therapy: Secondary | ICD-10-CM | POA: Insufficient documentation

## 2013-08-01 DIAGNOSIS — R1032 Left lower quadrant pain: Secondary | ICD-10-CM | POA: Insufficient documentation

## 2013-08-01 DIAGNOSIS — R109 Unspecified abdominal pain: Secondary | ICD-10-CM

## 2013-08-01 DIAGNOSIS — Z87442 Personal history of urinary calculi: Secondary | ICD-10-CM | POA: Insufficient documentation

## 2013-08-01 DIAGNOSIS — Z87891 Personal history of nicotine dependence: Secondary | ICD-10-CM | POA: Insufficient documentation

## 2013-08-01 DIAGNOSIS — E119 Type 2 diabetes mellitus without complications: Secondary | ICD-10-CM | POA: Insufficient documentation

## 2013-08-01 DIAGNOSIS — I10 Essential (primary) hypertension: Secondary | ICD-10-CM | POA: Insufficient documentation

## 2013-08-01 HISTORY — DX: Essential (primary) hypertension: I10

## 2013-08-01 LAB — URINALYSIS, ROUTINE W REFLEX MICROSCOPIC
Bilirubin Urine: NEGATIVE
GLUCOSE, UA: NEGATIVE mg/dL
Hgb urine dipstick: NEGATIVE
KETONES UR: NEGATIVE mg/dL
LEUKOCYTES UA: NEGATIVE
Nitrite: NEGATIVE
PH: 5.5 (ref 5.0–8.0)
Protein, ur: NEGATIVE mg/dL
Specific Gravity, Urine: >1.03 — ABNORMAL HIGH (ref 1.005–1.030)
Urobilinogen, UA: 0.2 mg/dL (ref 0.0–1.0)

## 2013-08-01 LAB — CBC WITH DIFFERENTIAL/PLATELET
Basophils Absolute: 0 10*3/uL (ref 0.0–0.1)
Basophils Relative: 0 % (ref 0–1)
Eosinophils Absolute: 0.2 10*3/uL (ref 0.0–0.7)
Eosinophils Relative: 2 % (ref 0–5)
HEMATOCRIT: 40.5 % (ref 36.0–46.0)
HEMOGLOBIN: 13.3 g/dL (ref 12.0–15.0)
LYMPHS ABS: 3.9 10*3/uL (ref 0.7–4.0)
LYMPHS PCT: 38 % (ref 12–46)
MCH: 30.1 pg (ref 26.0–34.0)
MCHC: 32.8 g/dL (ref 30.0–36.0)
MCV: 91.6 fL (ref 78.0–100.0)
MONO ABS: 0.5 10*3/uL (ref 0.1–1.0)
MONOS PCT: 5 % (ref 3–12)
NEUTROS ABS: 5.6 10*3/uL (ref 1.7–7.7)
Neutrophils Relative %: 55 % (ref 43–77)
Platelets: 279 10*3/uL (ref 150–400)
RBC: 4.42 MIL/uL (ref 3.87–5.11)
RDW: 12.7 % (ref 11.5–15.5)
WBC: 10.2 10*3/uL (ref 4.0–10.5)

## 2013-08-01 LAB — COMPREHENSIVE METABOLIC PANEL
ALT: 19 U/L (ref 0–35)
AST: 20 U/L (ref 0–37)
Albumin: 3.7 g/dL (ref 3.5–5.2)
Alkaline Phosphatase: 99 U/L (ref 39–117)
BILIRUBIN TOTAL: 0.2 mg/dL — AB (ref 0.3–1.2)
BUN: 9 mg/dL (ref 6–23)
CHLORIDE: 100 meq/L (ref 96–112)
CO2: 27 meq/L (ref 19–32)
CREATININE: 0.55 mg/dL (ref 0.50–1.10)
Calcium: 9.8 mg/dL (ref 8.4–10.5)
GFR calc Af Amer: >90 mL/min (ref >90)
GLUCOSE: 163 mg/dL — AB (ref 70–99)
Potassium: 4.1 mEq/L (ref 3.7–5.3)
Sodium: 139 mEq/L (ref 137–147)
Total Protein: 8 g/dL (ref 6.0–8.3)

## 2013-08-01 MED ORDER — PROMETHAZINE HCL 25 MG PO TABS: 25.0000 mg | ORAL_TABLET | Freq: Four times a day (QID) | ORAL | Status: DC | PRN

## 2013-08-01 MED ORDER — ONDANSETRON HCL 4 MG/2ML IJ SOLN
4.0000 mg | Freq: Once | INTRAMUSCULAR | Status: AC
  Administered 2013-08-01: 4 mg via INTRAVENOUS
  Filled 2013-08-01: qty 2

## 2013-08-01 MED ORDER — HYDROCODONE-ACETAMINOPHEN 5-325 MG PO TABS
1.0000 | ORAL_TABLET | Freq: Four times a day (QID) | ORAL | Status: DC | PRN
Start: 2013-08-01 — End: 2015-05-05

## 2013-08-01 MED ORDER — HYDROMORPHONE HCL PF 1 MG/ML IJ SOLN
1.0000 mg | Freq: Once | INTRAMUSCULAR | Status: AC
  Administered 2013-08-01: 1 mg via INTRAVENOUS
  Filled 2013-08-01: qty 1

## 2013-08-01 NOTE — ED Notes (Signed)
Pain LLQ, no n/v, alert,

## 2013-08-01 NOTE — ED Notes (Signed)
Pt alert & oriented x4, stable gait. Patient given discharge instructions, paperwork & prescription(s). Patient  instructed to stop at the registration desk to finish any additional paperwork. Patient verbalized understanding. Pt left department w/ no further questions. 

## 2013-08-01 NOTE — ED Provider Notes (Addendum)
CSN: [id number]     Arrival date & time 08/01/13  2140 History  This chart was scribed for Benny Lennert, MD by Quintella Reichert, ED scribe.  This patient was seen in room [facility]03[facility] and the patient's care was started at 10:12 PM.   Chief Complaint  Patient presents with  . Flank Pain    Patient is a 33 y.o. female presenting with flank pain. The history is provided by the patient. No language interpreter was used.  Flank Pain This is a new problem. The current episode started 12 to 24 hours ago. The problem occurs constantly. The problem has been gradually worsening. Associated symptoms include abdominal pain. Pertinent negatives include no chest pain and no headaches. Exacerbated by: urinating. Nothing relieves the symptoms. She has tried nothing for the symptoms.    HPI Comments: JASVINDER HINNENKAMP is a 33 y.o. female with h/o kidney stones, DM and HTN who presents to the Emergency Department complaining of LLQ abdominal pain that began today.  Pt states pain extends from her left flank into her LLQ abdomen.  Pain is worse after urinating.  She admits to h/o similar pain due to a kidney stone on one occasion.  She denies fevers or chills.  She has h/o hysterectomy.   Past Medical History  Diagnosis Date  . Diabetes mellitus   . Kidney stones   . Hypertension     Past Surgical History  Procedure Laterality Date  . Cholecystectomy    . Abdominal hysterectomy      Family History  Problem Relation Age of Onset  . Diabetes Mother   . Heart failure Father   . Cancer Brother     History  Substance Use Topics  . Smoking status: Former Games developer  . Smokeless tobacco: Not on file  . Alcohol Use: No    OB History   Grav Para Term Preterm Abortions TAB SAB Ect Mult Living                   Review of Systems  Constitutional: Negative for appetite change and fatigue.  HENT: Negative for congestion, ear discharge and sinus pressure.   Eyes: Negative for discharge.  Respiratory:  Negative for cough.   Cardiovascular: Negative for chest pain.  Gastrointestinal: Positive for abdominal pain. Negative for diarrhea.  Genitourinary: Positive for flank pain. Negative for frequency and hematuria.  Musculoskeletal: Negative for back pain.  Skin: Negative for rash.  Neurological: Negative for seizures and headaches.  Psychiatric/Behavioral: Negative for hallucinations.      Allergies  Review of patient's allergies indicates no known allergies.  Home Medications   Prior to Admission medications   Medication Sig Start Date End Date Taking? Authorizing Provider  diphenhydrAMINE (BENADRYL) 25 MG tablet Take 50 mg by mouth daily as needed for allergies.   Yes Historical Provider, MD  ibuprofen (ADVIL,MOTRIN) 200 MG tablet Take 400 mg by mouth every 6 (six) hours as needed for pain.   Yes Historical Provider, MD  metFORMIN (GLUCOPHAGE) 1000 MG tablet Take 1,000 mg by mouth 2 (two) times daily.   Yes Historical Provider, MD  tetrahydrozoline-zinc (VISINE-AC) 0.05-0.25 % ophthalmic solution Place 2 drops into both eyes daily as needed (for allergies).   Yes Historical Provider, MD   BP 157/81  Pulse 85  Temp(Src) 97.9 F (36.6 C) (Oral)  Resp 20  Ht 5\' 10"  (1.778 m)  Wt 329 lb 14.4 oz (149.642 kg)  BMI 47.34 kg/m2  SpO2 100%  LMP 11/04/2010  Physical Exam  Nursing note and vitals reviewed. Constitutional: She is oriented to person, place, and time. She appears well-developed.  HENT:  Head: Normocephalic.  Eyes: Conjunctivae and EOM are normal. No scleral icterus.  Neck: Neck supple. No thyromegaly present.  Cardiovascular: Normal rate and regular rhythm.  Exam reveals no gallop and no friction rub.   No murmur heard. Pulmonary/Chest: No stridor. She has no wheezes. She has no rales. She exhibits no tenderness.  Abdominal: She exhibits no distension. There is tenderness (moderate LLQ and left flank tenderness). There is no rebound.  Musculoskeletal: Normal range of  motion. She exhibits no edema.  Lymphadenopathy:    She has no cervical adenopathy.  Neurological: She is oriented to person, place, and time. She exhibits normal muscle tone. Coordination normal.  Skin: No rash noted. No erythema.  Psychiatric: She has a normal mood and affect. Her behavior is normal.    ED Course  Procedures (including critical care time)  DIAGNOSTIC STUDIES: Oxygen Saturation is 100% on room air, normal by my interpretation.    COORDINATION OF CARE: 10:14 PM-Discussed treatment plan which includes UA with pt at bedside and pt agreed to plan.     Labs Review Labs Reviewed  URINALYSIS, ROUTINE W REFLEX MICROSCOPIC    Imaging Review No results found.   EKG Interpretation None      MDM   Final diagnoses:  None   Pt to get mri tomorrow The chart was scribed for me under my direct supervision.  I personally performed the history, physical, and medical decision making and all procedures in the evaluation of this patient.Benny Lennert.   Coyle Stordahl L Jazlyn Tippens, MD 08/01/13 [id number]  Benny LennertJoseph L Emmabelle Fear, MD 08/01/13 [phone]

## 2013-08-01 NOTE — Discharge Instructions (Signed)
Follow up tomorrow to get mri

## 2013-08-02 ENCOUNTER — Ambulatory Visit (HOSPITAL_COMMUNITY)
Admit: 2013-08-02 | Discharge: 2013-08-02 | Disposition: A | Discharge status: Home / Self Care | Payer: BC Managed Care – PPO | Attending: Emergency Medicine | Admitting: Emergency Medicine

## 2013-08-02 ENCOUNTER — Other Ambulatory Visit (HOSPITAL_COMMUNITY): Payer: Self-pay | Admitting: Emergency Medicine

## 2013-08-02 DIAGNOSIS — IMO0002 Reserved for concepts with insufficient information to code with codable children: Secondary | ICD-10-CM

## 2013-08-02 DIAGNOSIS — R229 Localized swelling, mass and lump, unspecified: Principal | ICD-10-CM

## 2013-10-23 ENCOUNTER — Emergency Department (HOSPITAL_COMMUNITY)
Admission: EM | Admit: 2013-10-23 | Discharge: 2013-10-23 | Disposition: A | Discharge status: Home / Self Care | Payer: BC Managed Care – PPO | Attending: Emergency Medicine | Admitting: Emergency Medicine

## 2013-10-23 ENCOUNTER — Encounter (HOSPITAL_COMMUNITY): Payer: Self-pay | Admitting: Emergency Medicine

## 2013-10-23 DIAGNOSIS — Z87442 Personal history of urinary calculi: Secondary | ICD-10-CM | POA: Diagnosis not present

## 2013-10-23 DIAGNOSIS — Z79899 Other long term (current) drug therapy: Secondary | ICD-10-CM | POA: Diagnosis not present

## 2013-10-23 DIAGNOSIS — R21 Rash and other nonspecific skin eruption: Secondary | ICD-10-CM | POA: Insufficient documentation

## 2013-10-23 DIAGNOSIS — B86 Scabies: Secondary | ICD-10-CM | POA: Insufficient documentation

## 2013-10-23 DIAGNOSIS — I10 Essential (primary) hypertension: Secondary | ICD-10-CM | POA: Insufficient documentation

## 2013-10-23 DIAGNOSIS — E119 Type 2 diabetes mellitus without complications: Secondary | ICD-10-CM | POA: Diagnosis not present

## 2013-10-23 DIAGNOSIS — Z87891 Personal history of nicotine dependence: Secondary | ICD-10-CM | POA: Diagnosis not present

## 2013-10-23 MED ORDER — PERMETHRIN 5 % EX CREA: TOPICAL_CREAM | CUTANEOUS | Status: DC

## 2013-10-23 NOTE — Discharge Instructions (Signed)
[facility] ° ° ° °Edward Hawkins MD. Specialty: Pulmonary Disease Contact information: [address]  °[address]  °[place] [address]  °[phone]  ° °Margaret Simpson, MD. Specialty: Family Medicine Contact information: [address]  °[place] [address]  °[phone]  ° °Scott Luking, MD. Specialty: Family Medicine Contact information: [address]  °[address]  °[place] [address]  °[phone]  ° °Tesfaye Fanta, MD Specialty: Internal Medicine Contact information: [address]  °[place] [address]  °[phone]  ° °Zach Hall, MD. Specialty: Internal Medicine Contact information: [address]  °[place] [address]  °[phone]  ° °Angus Mcinnis, MD. Specialty: Family Medicine Contact information: [address]  °[place] [address]  °[phone]  ° °Stephen Knowlton, MD. Specialty: Family Medicine Contact information: [address]  °[address]  ° [place] [address]  °[phone]  ° °Roy Fagan, MD. Specialty: Internal Medicine Contact information: [address]  °[address]  ° [place] [address]  °[phone]  ° ° °

## 2013-10-23 NOTE — ED Provider Notes (Signed)
CSN: [id number]     Arrival date & time 10/23/13  0604 History   None    Chief Complaint  Patient presents with  . Rash     (Consider location/radiation/quality/duration/timing/severity/associated sxs/prior Treatment) HPI Comments: Rash for one month, involves the upper extremities and trunk, between fingers and toes and at the belt line. Neck U. diagnosed with scabies, successfully treated with permethrin cream, mother has similar symptoms for one month as well living in the same house. Denies any other symptoms including fevers chills nausea vomiting. No topical exposures including new creams lotions shampoos or soaps  Patient is a 33 y.o. female presenting with rash. The history is provided by the patient.  Rash   Past Medical History  Diagnosis Date  . Diabetes mellitus   . Kidney stones   . Hypertension    Past Surgical History  Procedure Laterality Date  . Cholecystectomy    . Abdominal hysterectomy     Family History  Problem Relation Age of Onset  . Diabetes Mother   . Heart failure Father   . Cancer Brother    History  Substance Use Topics  . Smoking status: Former Games developer  . Smokeless tobacco: Not on file  . Alcohol Use: No   OB History   Grav Para Term Preterm Abortions TAB SAB Ect Mult Living                 Review of Systems  Skin: Positive for rash.  All other systems reviewed and are negative.     Allergies  Review of patient's allergies indicates no known allergies.  Home Medications   Prior to Admission medications   Medication Sig Start Date End Date Taking? Authorizing Provider  diphenhydrAMINE (BENADRYL) 25 MG tablet Take 50 mg by mouth daily as needed for allergies.   Yes Historical Provider, MD  ibuprofen (ADVIL,MOTRIN) 200 MG tablet Take 400 mg by mouth every 6 (six) hours as needed for pain.   Yes Historical Provider, MD  metFORMIN (GLUCOPHAGE) 1000 MG tablet Take 1,000 mg by mouth 2 (two) times daily.   Yes Historical Provider, MD   HYDROcodone-acetaminophen (NORCO/VICODIN) 5-325 MG per tablet Take 1 tablet by mouth every 6 (six) hours as needed for moderate pain. 08/01/13   Benny Lennert, MD  permethrin (ELIMITE) 5 % cream Apply to entire body other than face - let sit for 14 hours then wash off, may repeat in 1 week if still having symptoms 10/23/13   Vida Roller, MD  promethazine (PHENERGAN) 25 MG tablet Take 1 tablet (25 mg total) by mouth every 6 (six) hours as needed for nausea or vomiting. 08/01/13   Benny Lennert, MD  tetrahydrozoline-zinc (VISINE-AC) 0.05-0.25 % ophthalmic solution Place 2 drops into both eyes daily as needed (for allergies).    Historical Provider, MD   BP 185/112  Pulse 88  Temp(Src) 98.4 F (36.9 C) (Oral)  Resp 18  Ht  (1.778 m)  Wt 319 lb (144.697 kg)  BMI 45.77 kg/m2  SpO2 98%  LMP 11/04/2010 Physical Exam  Nursing note and vitals reviewed. Constitutional: She appears well-developed and well-nourished.  HENT:  Head: Normocephalic and atraumatic.  Eyes: Conjunctivae are normal. Right eye exhibits no discharge. Left eye exhibits no discharge. No scleral icterus.  Pulmonary/Chest: Effort normal. No respiratory distress.  Neurological: She is alert. Coordination normal.  Skin: Skin is warm and dry. Rash noted. She is not diaphoretic. No erythema.  Diffuse scabbing rash with several excoriated lesions -  no induration / absces or petechiae / purpura  Psychiatric: She has a normal mood and affect.    ED Course  Procedures (including critical care time) Labs Review Labs Reviewed - No data to display  Imaging Review No results found.    MDM   Final diagnoses:  Scabies    Pt with likely scabies - will dc/ with meds as below.  Meds given in ED:  Medications - No data to display  New Prescriptions   PERMETHRIN (ELIMITE) 5 % CREAM    Apply to entire body other than face - let sit for 14 hours then wash off, may repeat in 1 week if still having symptoms         Vida Roller, MD 10/23/13 [phone]

## 2013-10-23 NOTE — ED Notes (Signed)
I am itching and have a rash. My mom has it and so does  My nephew per pt.

## 2015-05-05 ENCOUNTER — Encounter (HOSPITAL_COMMUNITY): Payer: Self-pay | Admitting: *Deleted

## 2015-05-05 ENCOUNTER — Emergency Department (HOSPITAL_COMMUNITY)
Admission: EM | Admit: 2015-05-05 | Discharge: 2015-05-06 | Disposition: A | Discharge status: Home / Self Care | Payer: BLUE CROSS/BLUE SHIELD | Attending: Emergency Medicine | Admitting: Emergency Medicine

## 2015-05-05 DIAGNOSIS — F17200 Nicotine dependence, unspecified, uncomplicated: Secondary | ICD-10-CM | POA: Diagnosis not present

## 2015-05-05 DIAGNOSIS — I10 Essential (primary) hypertension: Secondary | ICD-10-CM | POA: Diagnosis not present

## 2015-05-05 DIAGNOSIS — R6889 Other general symptoms and signs: Secondary | ICD-10-CM

## 2015-05-05 DIAGNOSIS — Z79899 Other long term (current) drug therapy: Secondary | ICD-10-CM | POA: Insufficient documentation

## 2015-05-05 DIAGNOSIS — E1165 Type 2 diabetes mellitus with hyperglycemia: Secondary | ICD-10-CM | POA: Insufficient documentation

## 2015-05-05 DIAGNOSIS — Z791 Long term (current) use of non-steroidal anti-inflammatories (NSAID): Secondary | ICD-10-CM | POA: Diagnosis not present

## 2015-05-05 DIAGNOSIS — J111 Influenza due to unidentified influenza virus with other respiratory manifestations: Secondary | ICD-10-CM | POA: Insufficient documentation

## 2015-05-05 DIAGNOSIS — R739 Hyperglycemia, unspecified: Secondary | ICD-10-CM

## 2015-05-05 DIAGNOSIS — R52 Pain, unspecified: Secondary | ICD-10-CM | POA: Diagnosis present

## 2015-05-05 MED ORDER — ONDANSETRON 4 MG PO TBDP
4.0000 mg | ORAL_TABLET | Freq: Once | ORAL | Status: AC
  Administered 2015-05-06: 4 mg via ORAL
  Filled 2015-05-05: qty 1

## 2015-05-05 MED ORDER — LOPERAMIDE HCL 2 MG PO CAPS
4.0000 mg | ORAL_CAPSULE | Freq: Once | ORAL | Status: AC
Start: 2015-05-05 — End: 2015-05-06
  Administered 2015-05-06: 4 mg via ORAL
  Filled 2015-05-05: qty 2

## 2015-05-05 NOTE — ED Notes (Signed)
Pt states x 3 days ago she had some episodes of diarrhea and today she began to cough and have generalized body aches

## 2015-05-05 NOTE — ED Provider Notes (Signed)
CSN: [id number]     Arrival date & time 05/05/15  2236 History  By signing my name below, I, Olivia Webb, attest that this documentation has been prepared under the direction and in the presence of Olivia Batonourtney F Vee Bahe, MD. Electronically Signed: Tanda RockersMargaux Webb, ED Scribe. 05/05/2015. 11:29 PM.   Chief Complaint  Patient presents with  . Generalized Body Aches   The history is provided by the patient. No language interpreter was used.     HPI Comments: Olivia Webb is a 35 y.o. female with PMHx DM and HTN who presents to the Emergency Department complaining of gradual onset, constant, generalized body aches that began today. Pt reports that she had diarrhea 3 days ago that has since resolved. Today she began having congestion, nausea, dry heaving, headache, and a mild cough. Pt has been taking Theraflu without relief.She has had recent sick contact with similar symptoms.  She did not receive a flu vaccine this year. Denies fever, vomiting, chest pain, shortness of breath, abdominal pain, or any other associated symptoms.   Past Medical History  Diagnosis Date  . Diabetes mellitus   . Kidney stones   . Hypertension    Past Surgical History  Procedure Laterality Date  . Cholecystectomy    . Abdominal hysterectomy     Family History  Problem Relation Age of Onset  . Diabetes Mother   . Heart failure Father   . Cancer Brother    Social History  Substance Use Topics  . Smoking status: Current Some Day Smoker -- 0.50 packs/day  . Smokeless tobacco: Webb  . Alcohol Use: No   OB History    No data available     Review of Systems  Constitutional: Positive for chills. Negative for fever.  HENT: Positive for congestion.   Respiratory: Positive for cough. Negative for shortness of breath.   Cardiovascular: Negative for chest pain.  Gastrointestinal: Positive for nausea and diarrhea (resolved). Negative for vomiting and abdominal pain.  Musculoskeletal: Positive for myalgias.   Neurological: Positive for headaches.  All other systems reviewed and are negative.  Allergies  Review of patient's allergies indicates no known allergies.  Home Medications   Prior to Admission medications   Medication Sig Start Date End Date Taking? Authorizing Provider  diphenhydrAMINE (BENADRYL) 25 MG tablet Take 50 mg by mouth daily as needed for allergies.   Yes Historical Provider, MD  ibuprofen (ADVIL,MOTRIN) 200 MG tablet Take 400 mg by mouth every 6 (six) hours as needed for pain.   Yes Historical Provider, MD  Pheniramine-PE-APAP [facility](THERAFLU COLD & SORE THROAT) 20-10-325 MG PACK Take 1 packet by mouth daily as needed (for cold and cough symptoms).   Yes Historical Provider, MD  tetrahydrozoline-zinc (VISINE-AC) 0.05-0.25 % ophthalmic solution Place 2 drops into both eyes daily as needed (for allergies).   Yes Historical Provider, MD  loperamide (IMODIUM) 2 MG capsule Take 1 capsule (2 mg total) by mouth as needed for diarrhea or loose stools. 05/06/15   Olivia Batonourtney F Tremon Sainvil, MD  ondansetron (ZOFRAN-ODT) 4 MG disintegrating tablet Take 1 tablet (4 mg total) by mouth every 8 (eight) hours as needed for nausea or vomiting. 05/06/15   Olivia Batonourtney F Lenis Nettleton, MD  sodium chloride (OCEAN) 0.65 % SOLN nasal spray Place 1 spray into both nostrils as needed for congestion. 05/06/15   Olivia Batonourtney F Markelle Asaro, MD   BP 131/81 mmHg  Pulse 104  Temp(Src) 98.7 F (37.1 C) (Oral)  Resp 20  Ht 5\' 10"  (1.778 m)  Wt 302 lb (136.986 kg)  BMI 43.33 kg/m2  SpO2 98%  LMP 11/04/2010   Physical Exam  Constitutional: She is oriented to person, place, and time. She appears well-developed and well-nourished.  Obese  HENT:  Head: Normocephalic and atraumatic.  Right Ear: External ear normal.  Left Ear: External ear normal.  Mouth/Throat: Oropharynx is clear and moist. No oropharyngeal exudate.  Nasal congestion noted, no tenderness to palpation over the maxillary or frontal sinuses  Neck: Neck supple.   Cardiovascular: Normal rate, regular rhythm and normal heart sounds.   No murmur heard. Pulmonary/Chest: Effort normal and breath sounds normal. No respiratory distress. She has no wheezes.  Abdominal: Soft. Bowel sounds are normal. There is no tenderness. There is no rebound.  Lymphadenopathy:    She has no cervical adenopathy.  Neurological: She is alert and oriented to person, place, and time.  Skin: Skin is warm and dry.  Psychiatric: She has a normal mood and affect.  Nursing note and vitals reviewed.   ED Course  Procedures (including critical care time)  DIAGNOSTIC STUDIES: Oxygen Saturation is 99% on RA, normal by my interpretation.    COORDINATION OF CARE: 11:29 PM-Discussed treatment plan with pt at bedside and pt agreed to plan.   Labs Review Labs Reviewed  CBG MONITORING, ED - Abnormal; Notable for the following:    Glucose-Capillary 316 (*)    All other components within normal limits  I-STAT CHEM 8, ED - Abnormal; Notable for the following:    Chloride 99 (*)    Glucose, Bld 328 (*)    Hemoglobin 17.7 (*)    HCT 52.0 (*)    All other components within normal limits    Imaging Review No results found.   EKG Interpretation Webb      MDM   Final diagnoses:  Flu-like symptoms  Hyperglycemia   Patient presents with flulike symptoms. Ongoing for the last 3 days. Nontoxic on exam. Afebrile. Initial vital signs notable for mild tachycardia and a blood pressure 170/108. Bedside CBG 328.  Chem-8 without evidence of anion gap. Patient was given fluids and states that she feels better. Suspect acute viral etiology. Out of the window for Tamiflu. Discuss with patient supportive measures at home including ibuprofen, Imodium, Zofran, nasal saline for nasal congestion.  After history, exam, and medical workup I feel the patient has been appropriately medically screened and is safe for discharge home. Pertinent diagnoses were discussed with the patient. Patient was  given return precautions.  I personally performed the services described in this documentation, which was scribed in my presence. The recorded information has been reviewed and is accurate.      Olivia Baton, MD 05/06/15 [phone]

## 2015-05-06 LAB — CBG MONITORING, ED: Glucose-Capillary: 316 mg/dL — ABNORMAL HIGH (ref 65–99)

## 2015-05-06 LAB — I-STAT CHEM 8, ED
BUN: 8 mg/dL (ref 6–20)
CALCIUM ION: 1.16 mmol/L (ref 1.12–1.23)
CHLORIDE: 99 mmol/L — AB (ref 101–111)
Creatinine, Ser: 0.6 mg/dL (ref 0.44–1.00)
Glucose, Bld: 328 mg/dL — ABNORMAL HIGH (ref 65–99)
HCT: 52 % — ABNORMAL HIGH (ref 36.0–46.0)
Hemoglobin: 17.7 g/dL — ABNORMAL HIGH (ref 12.0–15.0)
POTASSIUM: 4.2 mmol/L (ref 3.5–5.1)
SODIUM: 137 mmol/L (ref 135–145)
TCO2: 25 mmol/L (ref 0–100)

## 2015-05-06 MED ORDER — SALINE SPRAY 0.65 % NA SOLN: 1.0000 | NASAL | Status: AC | PRN

## 2015-05-06 MED ORDER — ONDANSETRON 4 MG PO TBDP: 4.0000 mg | ORAL_TABLET | Freq: Three times a day (TID) | ORAL | Status: DC | PRN

## 2015-05-06 MED ORDER — SODIUM CHLORIDE 0.9 % IV BOLUS (SEPSIS)
1000.0000 mL | Freq: Once | INTRAVENOUS | Status: AC
  Administered 2015-05-06: 1000 mL via INTRAVENOUS

## 2015-05-06 MED ORDER — LOPERAMIDE HCL 2 MG PO CAPS: 2.0000 mg | ORAL_CAPSULE | ORAL | Status: AC | PRN

## 2015-05-06 NOTE — Discharge Instructions (Signed)
Viral Infections °A viral infection can be caused by different types of viruses. Most viral infections are not serious and resolve on their own. However, some infections may cause severe symptoms and may lead to further complications. °SYMPTOMS °Viruses can frequently cause: °· Minor sore throat. °· Aches and pains. °· Headaches. °· Runny nose. °· Different types of rashes. °· Watery eyes. °· Tiredness. °· Cough. °· Loss of appetite. °· Gastrointestinal infections, resulting in nausea, vomiting, and diarrhea. °These symptoms do not respond to antibiotics because the infection is not caused by bacteria. However, you might catch a bacterial infection following the viral infection. This is sometimes called a "superinfection." Symptoms of such a bacterial infection may include: °· Worsening sore throat with pus and difficulty swallowing. °· Swollen neck glands. °· Chills and a high or persistent fever. °· Severe headache. °· Tenderness over the sinuses. °· Persistent overall ill feeling (malaise), muscle aches, and tiredness (fatigue). °· Persistent cough. °· Yellow, green, or brown mucus production with coughing. °HOME CARE INSTRUCTIONS  °· Only take over-the-counter or prescription medicines for pain, discomfort, diarrhea, or fever as directed by your caregiver. °· Drink enough water and fluids to keep your urine clear or pale yellow. Sports drinks can provide valuable electrolytes, sugars, and hydration. °· Get plenty of rest and maintain proper nutrition. Soups and broths with crackers or rice are fine. °SEEK IMMEDIATE MEDICAL CARE IF:  °· You have severe headaches, shortness of breath, chest pain, neck pain, or an unusual rash. °· You have uncontrolled vomiting, diarrhea, or you are unable to keep down fluids. °· You or your child has an oral temperature above 102° F (38.9° C), not controlled by medicine. °· Your baby is older than 3 months with a rectal temperature of 102° F (38.9° C) or higher. °· Your baby is 3  months old or younger with a rectal temperature of 100.4° F (38° C) or higher. °MAKE SURE YOU:  °· Understand these instructions. °· Will watch your condition. °· Will get help right away if you are not doing well or get worse. °  °This information is not intended to replace advice given to you by your health care provider. Make sure you discuss any questions you have with your health care provider. °  °Document Released: 11/24/2004 Document Revised: 05/09/2011 Document Reviewed: 07/23/2014 °Elsevier Interactive Patient Education ©2016 [employer] ° °

## 2015-05-06 NOTE — ED Notes (Signed)
Patient states that she is not having any pain at this time, but states that she is very nauseated.

## 2017-07-29 ENCOUNTER — Emergency Department (HOSPITAL_COMMUNITY): Payer: BLUE CROSS/BLUE SHIELD

## 2017-07-29 ENCOUNTER — Encounter (HOSPITAL_COMMUNITY): Payer: Self-pay | Admitting: Emergency Medicine

## 2017-07-29 ENCOUNTER — Other Ambulatory Visit: Payer: Self-pay

## 2017-07-29 ENCOUNTER — Emergency Department (HOSPITAL_COMMUNITY)
Admission: EM | Admit: 2017-07-29 | Discharge: 2017-07-29 | Disposition: A | Discharge status: Home / Self Care | Payer: BLUE CROSS/BLUE SHIELD | Attending: Emergency Medicine | Admitting: Emergency Medicine

## 2017-07-29 DIAGNOSIS — R51 Headache: Secondary | ICD-10-CM

## 2017-07-29 DIAGNOSIS — I160 Hypertensive urgency: Secondary | ICD-10-CM | POA: Insufficient documentation

## 2017-07-29 DIAGNOSIS — E1165 Type 2 diabetes mellitus with hyperglycemia: Secondary | ICD-10-CM | POA: Diagnosis not present

## 2017-07-29 DIAGNOSIS — Z7984 Long term (current) use of oral hypoglycemic drugs: Secondary | ICD-10-CM | POA: Insufficient documentation

## 2017-07-29 DIAGNOSIS — Z87891 Personal history of nicotine dependence: Secondary | ICD-10-CM | POA: Insufficient documentation

## 2017-07-29 DIAGNOSIS — R739 Hyperglycemia, unspecified: Secondary | ICD-10-CM

## 2017-07-29 DIAGNOSIS — R519 Headache, unspecified: Secondary | ICD-10-CM

## 2017-07-29 LAB — I-STAT CHEM 8, ED
BUN: 11 mg/dL (ref 6–20)
CHLORIDE: 100 mmol/L — AB (ref 101–111)
CREATININE: 0.4 mg/dL — AB (ref 0.44–1.00)
Calcium, Ion: 1.19 mmol/L (ref 1.15–1.40)
Glucose, Bld: 297 mg/dL — ABNORMAL HIGH (ref 65–99)
HEMATOCRIT: 48 % — AB (ref 36.0–46.0)
HEMOGLOBIN: 16.3 g/dL — AB (ref 12.0–15.0)
POTASSIUM: 4 mmol/L (ref 3.5–5.1)
Sodium: 136 mmol/L (ref 135–145)
TCO2: 27 mmol/L (ref 22–32)

## 2017-07-29 MED ORDER — LABETALOL HCL 5 MG/ML IV SOLN
10.0000 mg | Freq: Once | INTRAVENOUS | Status: AC
  Administered 2017-07-29: 10 mg via INTRAVENOUS
  Filled 2017-07-29: qty 4

## 2017-07-29 MED ORDER — METFORMIN HCL 500 MG PO TABS: 500.0000 mg | ORAL_TABLET | Freq: Two times a day (BID) | ORAL | 0 refills | Status: AC

## 2017-07-29 MED ORDER — LISINOPRIL 10 MG PO TABS
10.0000 mg | ORAL_TABLET | Freq: Every day | ORAL | Status: AC
  Administered 2017-07-29: 10 mg via ORAL
  Filled 2017-07-29: qty 1

## 2017-07-29 MED ORDER — LISINOPRIL 10 MG PO TABS: 10.0000 mg | ORAL_TABLET | Freq: Every day | ORAL | 0 refills | Status: AC

## 2017-07-29 MED ORDER — FLUTICASONE PROPIONATE 50 MCG/ACT NA SUSP: 1.0000 | Freq: Every day | NASAL | 0 refills | Status: AC | PRN

## 2017-07-29 NOTE — Discharge Instructions (Signed)
If you continue to have sinus  pressure despite the flonase nasal spray, you may also take Coricidin brand products (look for the heart on the box) - these are safe to take and will not interfere with your blood pressure like other sinus medications will. Keep your appointment with your new doctor on the 26th as planned.

## 2017-07-29 NOTE — ED Provider Notes (Signed)
[facility] Provider Note   CSN: [id number] Arrival date & time: 07/29/17  1559     History   Chief Complaint Chief Complaint  Patient presents with  . Facial Pain    HPI Olivia Webb is a 37 y.o. female with a past medical history of diabetes and hypertension, not currently on medications but is scheduled to establish care with a new PCP on June 26 presenting with a 2-week history of headache, sinus pressure and transient dizziness which is triggered by lying down or will occur briefly when standing from a  supine position.  She denies vision changes, focal weakness, nausea, vomiting, nasal discharge.  She does have nasal pressure and has been using various OTC medications including Mucinex decongestant nasal spray, Tylenol, ibuprofen without pain relief.  She has pain across her bilateral brow line, also with pressure beneath her bilateral eyes.  Although she denies any nasal discharge she has been having postnasal drip, she reports a fever to 101 1 week ago but not since.  Denies photophobia or phonophobia.  She used to be on lisinopril for her blood pressure, has run out of this medication at least months ago.  The history is provided by the patient.    Past Medical History:  Diagnosis Date  . Diabetes mellitus   . Hypertension   . Kidney stones     There are no active problems to display for this patient.   Past Surgical History:  Procedure Laterality Date  . ABDOMINAL HYSTERECTOMY    . CHOLECYSTECTOMY       OB History   None      Home Medications    Prior to Admission medications   Medication Sig Start Date End Date Taking? Authorizing Provider  acetaminophen (TYLENOL) 500 MG tablet Take 1,000 mg by mouth every 6 (six) hours as needed.   Yes [provider]  ibuprofen (ADVIL,MOTRIN) 200 MG tablet Take 400 mg by mouth every 6 (six) hours as needed for pain.   Yes [provider]  sodium chloride (OCEAN) 0.65 % SOLN nasal  spray Place 1 spray into both nostrils as needed for congestion. 05/06/15  Yes Horton, Mayer Maskerourtney F, MD  diphenhydrAMINE (BENADRYL) 25 MG tablet Take 50 mg by mouth daily as needed for allergies.    [provider]  fluticasone (FLONASE) 50 MCG/ACT nasal spray Place 1 spray into both nostrils daily as needed (nasal congestion). 07/29/17   Burgess AmorIdol, Kylil Swopes, PA-C  lisinopril (PRINIVIL,ZESTRIL) 10 MG tablet Take 1 tablet (10 mg total) by mouth daily. 07/29/17   Burgess AmorIdol, Sayward Horvath, PA-C  loperamide (IMODIUM) 2 MG capsule Take 1 capsule (2 mg total) by mouth as needed for diarrhea or loose stools. 05/06/15   Horton, Mayer Maskerourtney F, MD  metFORMIN (GLUCOPHAGE) 500 MG tablet Take 1 tablet (500 mg total) by mouth 2 (two) times daily with a meal. 07/29/17   Kiet Geer, Raynelle FanningJulie, PA-C  Pheniramine-PE-APAP [facility](THERAFLU COLD & SORE THROAT) 20-10-325 MG PACK Take 1 packet by mouth daily as needed (for cold and cough symptoms).    [provider]    Family History Family History  Problem Relation Age of Onset  . Diabetes Mother   . Heart failure Father   . Cancer Brother     Social History Social History   Tobacco Use  . Smoking status: Former Smoker    Packs/day: 0.50  . Smokeless tobacco: Never Used  Substance Use Topics  . Alcohol use: No  . Drug use: No  Allergies   Patient has no known allergies.   Review of Systems Review of Systems  Constitutional: Positive for fever.  HENT: Positive for congestion, postnasal drip, sinus pressure and sinus pain. Negative for ear pain, facial swelling, rhinorrhea, sore throat and tinnitus.   Eyes: Negative.  Negative for photophobia and visual disturbance.  Respiratory: Negative for chest tightness and shortness of breath.   Cardiovascular: Negative for chest pain.  Gastrointestinal: Negative for abdominal pain and nausea.  Genitourinary: Negative.   Musculoskeletal: Negative for arthralgias, joint swelling and neck pain.  Skin: Negative.  Negative for rash and  wound.  Neurological: Positive for dizziness and headaches. Negative for tremors, speech difficulty, weakness and numbness.  Psychiatric/Behavioral: Negative.      Physical Exam Updated Vital Signs BP (!) 175/122   Pulse 87   Temp 98.2 F (36.8 C) (Oral)   Resp 20   Ht 5\' 9"  (1.753 m)   Wt 131.3 kg (289 lb 6.4 oz)   LMP 11/04/2010   SpO2 99%   BMI 42.74 kg/m   Physical Exam  Constitutional: She is oriented to person, place, and time. She appears well-developed and well-nourished.  HENT:  Head: Normocephalic and atraumatic.  Nose: No mucosal edema or rhinorrhea. Right sinus exhibits maxillary sinus tenderness and frontal sinus tenderness. Left sinus exhibits maxillary sinus tenderness and frontal sinus tenderness.  Mouth/Throat: Oropharynx is clear and moist.  Eyes: Pupils are equal, round, and reactive to light. EOM and lids are normal.  Neck: Normal range of motion. Neck supple.  Cardiovascular: Normal rate and normal heart sounds.  Pulmonary/Chest: Effort normal.  Abdominal: Soft. There is no tenderness.  Musculoskeletal: Normal range of motion.  Lymphadenopathy:    She has no cervical adenopathy.  Neurological: She is alert and oriented to person, place, and time. She has normal strength. No cranial nerve deficit or sensory deficit. Gait normal. GCS eye subscore is 4. GCS verbal subscore is 5. GCS motor subscore is 6.  Normal heel-shin, normal rapid alternating movements. Cranial nerves III-XII intact.  No pronator drift.  Skin: Skin is warm and dry. No rash noted.  Psychiatric: She has a normal mood and affect. Her speech is normal and behavior is normal. Thought content normal. Cognition and memory are normal.  Nursing note and vitals reviewed.    ED Treatments / Results  Labs (all labs ordered are listed, but only abnormal results are displayed) Labs Reviewed  I-STAT CHEM 8, ED - Abnormal; Notable for the following components:      Result Value   Chloride 100 (*)     Creatinine, Ser 0.40 (*)    Glucose, Bld 297 (*)    Hemoglobin 16.3 (*)    HCT 48.0 (*)    All other components within normal limits    EKG None  Radiology Ct Head Wo Contrast  Result Date: 07/29/2017 CLINICAL DATA:  Sinus headaches EXAM: CT HEAD WITHOUT CONTRAST CT MAXILLOFACIAL WITHOUT CONTRAST TECHNIQUE: Multidetector CT imaging of the head and maxillofacial structures were performed using the standard protocol without intravenous contrast. Multiplanar CT image reconstructions of the maxillofacial structures were also generated. 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 No findings to suggest acute hemorrhage, acute infarction or space-occupying mass lesion are noted. Vascular: No hyperdense vessel or unexpected calcification. Skull: Normal. Negative for fracture or focal lesion. Other: None CT MAXILLOFACIAL FINDINGS Osseous: No fracture or mandibular dislocation. No destructive process. Considerable dental caries  are again identified with periapical lucencies relatively stable from the prior exam. Orbits: Negative. No traumatic or inflammatory finding. Sinuses: Mild mucosal thickening is noted within the maxillary antrum on the right. No other sinus abnormality is seen. Soft tissues: Previously seen inflammatory changes along the right jaw have resolved in the interval. IMPRESSION: CT of the head: No acute intracranial abnormality noted. No change from the prior exam. CT of the maxillofacial bones: No acute bony abnormality is seen. Extensive dental caries with periapical lucencies are again identified. The previously seen soft tissue infection has resolved in the interval. Mucosal thickening within the right maxillary antrum. Electronically Signed   By: Alcide Clever M.D.   On: 07/29/2017 18:17   Ct Maxillofacial Wo Contrast  Result Date: 07/29/2017 CLINICAL DATA:  Sinus headaches EXAM: CT HEAD WITHOUT CONTRAST CT  MAXILLOFACIAL WITHOUT CONTRAST TECHNIQUE: Multidetector CT imaging of the head and maxillofacial structures were performed using the standard protocol without intravenous contrast. Multiplanar CT image reconstructions of the maxillofacial structures were also generated. 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 No findings to suggest acute hemorrhage, acute infarction or space-occupying mass lesion are noted. Vascular: No hyperdense vessel or unexpected calcification. Skull: Normal. Negative for fracture or focal lesion. Other: None CT MAXILLOFACIAL FINDINGS Osseous: No fracture or mandibular dislocation. No destructive process. Considerable dental caries are again identified with periapical lucencies relatively stable from the prior exam. Orbits: Negative. No traumatic or inflammatory finding. Sinuses: Mild mucosal thickening is noted within the maxillary antrum on the right. No other sinus abnormality is seen. Soft tissues: Previously seen inflammatory changes along the right jaw have resolved in the interval. IMPRESSION: CT of the head: No acute intracranial abnormality noted. No change from the prior exam. CT of the maxillofacial bones: No acute bony abnormality is seen. Extensive dental caries with periapical lucencies are again identified. The previously seen soft tissue infection has resolved in the interval. Mucosal thickening within the right maxillary antrum. Electronically Signed   By: Alcide Clever M.D.   On: 07/29/2017 18:17    Procedures Procedures (including critical care time)  Medications Ordered in ED Medications  lisinopril (PRINIVIL,ZESTRIL) tablet 10 mg (10 mg Oral Given 07/29/17 1838)  labetalol (NORMODYNE,TRANDATE) injection 10 mg (10 mg Intravenous Given 07/29/17 1840)     Initial Impression / Assessment and Plan / ED Course  I have reviewed the triage vital signs and the nursing notes.  Pertinent labs  & imaging results that were available during my care of the patient were reviewed by me and considered in my medical decision making (see chart for details).     Imaging and labs reviewed and discussed with pt. headache symptom was improved at time of discharge as was her blood pressure, although still elevated.  She was given a dose of labetalol per IV during this visit, was also started on her lisinopril with first tablet given here.  I suspect her headache symptoms are at least partially secondary to her chronic sinusitis, although probably exacerbated by elevated blood pressure.  Advised to DC her OTC allergy and sinus medications.  Discussed Coricidin which is safer for her blood pressure.  Flonase.  Advised follow-up with her PCP, returning here in the interim for any worsening, new or persistent symptoms.  Final Clinical Impressions(s) / ED Diagnoses   Final diagnoses:  Nonintractable headache, unspecified chronicity pattern, unspecified headache type  Hypertensive urgency  Hyperglycemia    ED  Discharge Orders        Ordered    lisinopril (PRINIVIL,ZESTRIL) 10 MG tablet  Daily     07/29/17 1907    metFORMIN (GLUCOPHAGE) 500 MG tablet  2 times daily with meals     07/29/17 1907    fluticasone (FLONASE) 50 MCG/ACT nasal spray  Daily PRN     07/29/17 1907       Victoriano Lain 07/29/17 1945    Raeford Razor, MD 07/29/17 2059

## 2017-07-29 NOTE — ED Triage Notes (Signed)
Patient reports sinus pressure, headache and dizziness when she lays down. Onset of symptoms 2 weeks ago.

## 2023-07-24 ENCOUNTER — Encounter (HOSPITAL_COMMUNITY): Payer: Self-pay | Admitting: Emergency Medicine

## 2023-07-24 ENCOUNTER — Emergency Department (HOSPITAL_COMMUNITY)

## 2023-07-24 ENCOUNTER — Other Ambulatory Visit: Payer: Self-pay

## 2023-07-24 ENCOUNTER — Emergency Department (HOSPITAL_COMMUNITY)
Admission: EM | Admit: 2023-07-24 | Discharge: 2023-07-24 | Disposition: A | Discharge status: Home / Self Care | Attending: Emergency Medicine | Admitting: Emergency Medicine

## 2023-07-24 DIAGNOSIS — D72829 Elevated white blood cell count, unspecified: Secondary | ICD-10-CM | POA: Diagnosis not present

## 2023-07-24 DIAGNOSIS — Z7984 Long term (current) use of oral hypoglycemic drugs: Secondary | ICD-10-CM | POA: Insufficient documentation

## 2023-07-24 DIAGNOSIS — I10 Essential (primary) hypertension: Secondary | ICD-10-CM | POA: Diagnosis not present

## 2023-07-24 DIAGNOSIS — E871 Hypo-osmolality and hyponatremia: Secondary | ICD-10-CM | POA: Insufficient documentation

## 2023-07-24 DIAGNOSIS — Z79899 Other long term (current) drug therapy: Secondary | ICD-10-CM | POA: Diagnosis not present

## 2023-07-24 DIAGNOSIS — E119 Type 2 diabetes mellitus without complications: Secondary | ICD-10-CM | POA: Insufficient documentation

## 2023-07-24 DIAGNOSIS — D751 Secondary polycythemia: Secondary | ICD-10-CM | POA: Insufficient documentation

## 2023-07-24 DIAGNOSIS — Z87442 Personal history of urinary calculi: Secondary | ICD-10-CM | POA: Diagnosis not present

## 2023-07-24 DIAGNOSIS — N83202 Unspecified ovarian cyst, left side: Secondary | ICD-10-CM | POA: Insufficient documentation

## 2023-07-24 DIAGNOSIS — E876 Hypokalemia: Secondary | ICD-10-CM | POA: Insufficient documentation

## 2023-07-24 DIAGNOSIS — R102 Pelvic and perineal pain: Secondary | ICD-10-CM | POA: Diagnosis present

## 2023-07-24 LAB — CBC WITH DIFFERENTIAL/PLATELET
Abs Immature Granulocytes: 0.05 10*3/uL (ref 0.00–0.07)
Basophils Absolute: 0.1 10*3/uL (ref 0.0–0.1)
Basophils Relative: 1 %
Eosinophils Absolute: 0.1 10*3/uL (ref 0.0–0.5)
Eosinophils Relative: 1 %
HCT: 46.8 % — ABNORMAL HIGH (ref 36.0–46.0)
Hemoglobin: 15.7 g/dL — ABNORMAL HIGH (ref 12.0–15.0)
Immature Granulocytes: 1 %
Lymphocytes Relative: 33 %
Lymphs Abs: 3.6 10*3/uL (ref 0.7–4.0)
MCH: 30.2 pg (ref 26.0–34.0)
MCHC: 33.5 g/dL (ref 30.0–36.0)
MCV: 90 fL (ref 80.0–100.0)
Monocytes Absolute: 0.5 10*3/uL (ref 0.1–1.0)
Monocytes Relative: 4 %
Neutro Abs: 6.6 10*3/uL (ref 1.7–7.7)
Neutrophils Relative %: 60 %
Platelets: 244 10*3/uL (ref 150–400)
RBC: 5.2 MIL/uL — ABNORMAL HIGH (ref 3.87–5.11)
RDW: 13.5 % (ref 11.5–15.5)
WBC: 10.9 10*3/uL — ABNORMAL HIGH (ref 4.0–10.5)
nRBC: 0 % (ref 0.0–0.2)

## 2023-07-24 LAB — URINALYSIS, ROUTINE W REFLEX MICROSCOPIC
Bacteria, UA: NONE SEEN
Bilirubin Urine: NEGATIVE
Glucose, UA: >=500 mg/dL — AB
Hgb urine dipstick: NEGATIVE
Ketones, ur: NEGATIVE mg/dL
Nitrite: NEGATIVE
Protein, ur: NEGATIVE mg/dL
Specific Gravity, Urine: 1.027 (ref 1.005–1.030)
pH: 5 (ref 5.0–8.0)

## 2023-07-24 LAB — COMPREHENSIVE METABOLIC PANEL WITH GFR
ALT: 22 U/L (ref 0–44)
AST: 18 U/L (ref 15–41)
Albumin: 3.7 g/dL (ref 3.5–5.0)
Alkaline Phosphatase: 88 U/L (ref 38–126)
Anion gap: 7 (ref 5–15)
BUN: 12 mg/dL (ref 6–20)
CO2: 25 mmol/L (ref 22–32)
Calcium: 9.5 mg/dL (ref 8.9–10.3)
Chloride: 101 mmol/L (ref 98–111)
Creatinine, Ser: 0.63 mg/dL (ref 0.44–1.00)
GFR, Estimated: >60 mL/min (ref >60)
Glucose, Bld: 200 mg/dL — ABNORMAL HIGH (ref 70–99)
Potassium: 3.3 mmol/L — ABNORMAL LOW (ref 3.5–5.1)
Sodium: 133 mmol/L — ABNORMAL LOW (ref 135–145)
Total Bilirubin: 0.3 mg/dL (ref 0.0–1.2)
Total Protein: 7.5 g/dL (ref 6.5–8.1)

## 2023-07-24 LAB — LIPASE, BLOOD: Lipase: 41 U/L (ref 11–51)

## 2023-07-24 MED ORDER — POTASSIUM CHLORIDE CRYS ER 20 MEQ PO TBCR
40.0000 meq | EXTENDED_RELEASE_TABLET | Freq: Once | ORAL | Status: AC
  Administered 2023-07-24: 40 meq via ORAL
  Filled 2023-07-24: qty 2

## 2023-07-24 MED ORDER — POTASSIUM CHLORIDE CRYS ER 20 MEQ PO TBCR: 20.0000 meq | EXTENDED_RELEASE_TABLET | Freq: Two times a day (BID) | ORAL | 0 refills | Status: AC

## 2023-07-24 MED ORDER — IBUPROFEN 400 MG PO TABS
600.0000 mg | ORAL_TABLET | Freq: Once | ORAL | Status: AC
  Administered 2023-07-24: 600 mg via ORAL
  Filled 2023-07-24: qty 2

## 2023-07-24 MED ORDER — ONDANSETRON 8 MG PO TBDP
8.0000 mg | ORAL_TABLET | Freq: Once | ORAL | Status: AC
  Administered 2023-07-24: 8 mg via ORAL
  Filled 2023-07-24: qty 1

## 2023-07-24 NOTE — Discharge Instructions (Addendum)
 You may take acetaminophen  and/or ibuprofen  as needed for pain.  Please be aware that if you combine acetaminophen  and ibuprofen , you will get better pain relief and you get from taking other medication by itself.  Return to the emergency department if your pain is getting worse, or if you start running a fever.

## 2023-07-24 NOTE — ED Provider Notes (Signed)
 [place] EMERGENCY DEPARTMENT AT [facility] Provider Note   CSN: [id number] Arrival date & time: 07/24/23  0203     History  Chief Complaint  Patient presents with   L. groin pain    Olivia Webb is a 43 y.o. female.  The history is provided by the patient.  She has history of hypertension, diabetes and comes in complaining of left flank pain and left pelvic pain which started about 1 week ago and got worse over the last 24 hours.  Pain waxes and wanes but is definitely worse following urination.  There has been associated nausea and vomiting.  She denies fever or chills.  She does have history of kidney stones but thinks that this feels different.  She is status post hysterectomy but still has her ovaries.  She has taken ibuprofen  which has given her temporary relief.   Home Medications Prior to Admission medications   Medication Sig Start Date End Date Taking? Authorizing Provider  acetaminophen  (TYLENOL ) 500 MG tablet Take 1,000 mg by mouth every 6 (six) hours as needed.    [provider]  diphenhydrAMINE (BENADRYL) 25 MG tablet Take 50 mg by mouth daily as needed for allergies.    [provider]  fluticasone  (FLONASE ) 50 MCG/ACT nasal spray Place 1 spray into both nostrils daily as needed (nasal congestion). 07/29/17   Idol, Julie, PA-C  ibuprofen  (ADVIL ,MOTRIN ) 200 MG tablet Take 400 mg by mouth every 6 (six) hours as needed for pain.    [provider]  lisinopril  (PRINIVIL ,ZESTRIL ) 10 MG tablet Take 1 tablet (10 mg total) by mouth daily. 07/29/17   Idol, Julie, PA-C  loperamide  (IMODIUM ) 2 MG capsule Take 1 capsule (2 mg total) by mouth as needed for diarrhea or loose stools. 05/06/15   Horton, Vonzella Guernsey, MD  metFORMIN  (GLUCOPHAGE ) 500 MG tablet Take 1 tablet (500 mg total) by mouth 2 (two) times daily with a meal. 07/29/17   Idol, Concha Deed, PA-C  Pheniramine-PE-APAP [facility] COLD & SORE THROAT) 20-10-325 MG PACK Take 1 packet by mouth daily as  needed (for cold and cough symptoms).    [provider]  potassium chloride SA (KLOR-CON M) 20 MEQ tablet Take 1 tablet (20 mEq total) by mouth 2 (two) times daily. 07/24/23  Yes Alissa April, MD  sodium chloride  (OCEAN) 0.65 % SOLN nasal spray Place 1 spray into both nostrils as needed for congestion. 05/06/15   Horton, Vonzella Guernsey, MD      Allergies    Patient has no known allergies.    Review of Systems   Review of Systems  All other systems reviewed and are negative.   Physical Exam Updated Vital Signs BP 131/89   Pulse 93   Temp 98.9 F (37.2 C) (Oral)   Resp 16   Ht 5\' 9"  (1.753 m)   Wt 132 kg   LMP 11/04/2010   SpO2 94%   BMI 42.97 kg/m  Physical Exam Vitals and nursing note reviewed.   43 year old female, resting comfortably and in no acute distress. Vital signs are significant for borderline elevated blood pressure. Oxygen saturation is 96%, which is normal. Head is normocephalic and atraumatic. PERRLA, EOMI. . Back is nontender in the midline.  There is mild left CVA tenderness. Lungs are clear without rales, wheezes, or rhonchi. Heart has regular rate and rhythm without murmur. Abdomen is soft, flat, with mild left suprapubic tenderness.  There is no rebound or guarding. Neurologic: Mental status is  normal, moves all extremities equally.  ED Results / Procedures / Treatments   Labs (all labs ordered are listed, but only abnormal results are displayed) Labs Reviewed  URINALYSIS, ROUTINE W REFLEX MICROSCOPIC - Abnormal; Notable for the following components:      Result Value   Glucose, UA >=500 (*)    Leukocytes,Ua SMALL (*)    All other components within normal limits  COMPREHENSIVE METABOLIC PANEL WITH GFR - Abnormal; Notable for the following components:   Sodium 133 (*)    Potassium 3.3 (*)    Glucose, Bld 200 (*)    All other components within normal limits  CBC WITH DIFFERENTIAL/PLATELET - Abnormal; Notable for the following components:   WBC  10.9 (*)    RBC 5.20 (*)    Hemoglobin 15.7 (*)    HCT 46.8 (*)    All other components within normal limits  LIPASE, BLOOD   Radiology CT Renal Stone Study Result Date: 07/24/2023 CLINICAL DATA:  Left groin pain. EXAM: CT ABDOMEN AND PELVIS WITHOUT CONTRAST TECHNIQUE: Multidetector CT imaging of the abdomen and pelvis was performed following the standard protocol without IV contrast. RADIATION DOSE REDUCTION: This exam was performed according to the departmental dose-optimization program which includes automated exposure control, adjustment of the mA and/or kV according to patient size and/or use of iterative reconstruction technique. COMPARISON:  August 01, 2013 FINDINGS: Lower chest: No acute abnormality. Hepatobiliary: No focal liver abnormality is seen. Status post cholecystectomy. No biliary dilatation. Pancreas: Unremarkable. No pancreatic ductal dilatation or surrounding inflammatory changes. Spleen: Normal in size without focal abnormality. Adrenals/Urinary Tract: Adrenal glands are unremarkable. Kidneys are normal in size, without focal lesions. Bilateral 2 mm nonobstructing renal calculi are seen. No obstructing renal calculi or hydronephrosis is identified. Mild, bilateral nonspecific perinephric inflammatory fat stranding is seen. Bladder is unremarkable. Stomach/Bowel: Stomach is within normal limits. Appendix appears normal. No evidence of bowel wall thickening, distention, or inflammatory changes. Vascular/Lymphatic: Aortic atherosclerosis. No enlarged abdominal or pelvic lymph nodes. Reproductive: Status post hysterectomy. A 3.5 cm left adnexal cyst is seen. The right adnexa is unremarkable. Other: No abdominal wall hernia or abnormality. No abdominopelvic ascites. Musculoskeletal: No acute or significant osseous findings. IMPRESSION: 1. Bilateral 2 mm nonobstructing renal calculi. 2. 3.5 cm left adnexal cyst, likely ovarian in origin. No follow-up imaging is recommended. This recommendation  follows ACR consensus guidelines: White Paper of the ACR Incidental Findings Committee II on Adnexal Findings. J Am Coll Radiol 2013:10:675-681. 3. Evidence of prior cholecystectomy and hysterectomy. 4. Aortic atherosclerosis. Electronically Signed   By: Virgle Grime M.D.   On: 07/24/2023 05:02    Procedures Procedures    Medications Ordered in ED Medications  ondansetron  (ZOFRAN -ODT) disintegrating tablet 8 mg (8 mg Oral Given 07/24/23 0408)  ibuprofen  (ADVIL ) tablet 600 mg (600 mg Oral Given 07/24/23 0406)  potassium chloride SA (KLOR-CON M) CR tablet 40 mEq (40 mEq Oral Given 07/24/23 1610)    ED Course/ Medical Decision Making/ A&P                                 Medical Decision Making Amount and/or Complexity of Data Reviewed Labs: ordered. Radiology: ordered.  Risk Prescription drug management.   Left flank pain.  This is a presentation which involves a wide range of treatment options and has a significant risk for morbidity and complications.  Differential diagnosis includes, but is not limited to, urolithiasis with renal  colic, pyelonephritis, diverticulitis, ovarian cyst, ovarian torsion, abdominal aortic aneurysm.  I have reviewed her urinalysis and my interpretation is glucosuria consistent with history of diabetes, contaminated specimen, no evidence of UTI.  I have ordered laboratory workup, CBC and comprehensive metabolic panel and lipase and I have ordered renal stone protocol CTA scan of abdomen and pelvis.  I have ordered ibuprofen  for pain and ondansetron  for nausea.  I have reviewed her past records, and CT abdomen and pelvis on 06/11/2009 did show a distal left ureteral calculus, CT abdomen and pelvis on 08/01/2013 showed no renal calculi.  I have reviewed her laboratory tests, and my interpretation is mild hyponatremia which is not felt to be clinically significant, mild hypokalemia, normal lipase, mild polycythemia, mild leukocytosis which is nonspecific.  CTA of  abdomen and pelvis shows a 3.5 cm left adnexal cyst which is probably from her ovary and this does correlate with where her pain is.  Per radiology, there is no need for follow-up imaging.  I have advised her to use over-the-counter acetaminophen  and NSAIDs as needed for pain control.  I ordered a dose of oral potassium and I am discharging her with a prescription for potassium.  I have referred her to gynecology for follow-up.  Return for worsening pain, fever, vomiting.  Final Clinical Impression(s) / ED Diagnoses Final diagnoses:  Left ovarian cyst  Hyponatremia  Hypokalemia  Polycythemia    Rx / DC Orders ED Discharge Orders          Ordered    potassium chloride SA (KLOR-CON M) 20 MEQ tablet  2 times daily        07/24/23 1610              Alissa April, MD 07/24/23 [phone]

## 2023-07-24 NOTE — ED Triage Notes (Addendum)
 Pt with c/o pain in her L groin area./ lower abdomen area. States she thinks it may be her "ovary". Pt states pain originally started in her back and that she has a hx of kidney stones.

## 2024-03-29 ENCOUNTER — Emergency Department (HOSPITAL_COMMUNITY)
Admission: EM | Admit: 2024-03-29 | Discharge: 2024-03-29 | Discharge status: Against Medical Advice | Attending: Emergency Medicine | Admitting: Emergency Medicine

## 2024-03-29 DIAGNOSIS — N939 Abnormal uterine and vaginal bleeding, unspecified: Secondary | ICD-10-CM | POA: Diagnosis present

## 2024-03-29 DIAGNOSIS — R112 Nausea with vomiting, unspecified: Secondary | ICD-10-CM | POA: Diagnosis not present

## 2024-03-29 DIAGNOSIS — Z5321 Procedure and treatment not carried out due to patient leaving prior to being seen by health care provider: Secondary | ICD-10-CM | POA: Diagnosis not present

## 2024-03-29 LAB — URINALYSIS, ROUTINE W REFLEX MICROSCOPIC
Bacteria, UA: NONE SEEN
Bilirubin Urine: NEGATIVE
Glucose, UA: >=500 mg/dL — AB
Hgb urine dipstick: NEGATIVE
Ketones, ur: NEGATIVE mg/dL
Leukocytes,Ua: NEGATIVE
Nitrite: NEGATIVE
Protein, ur: NEGATIVE mg/dL
Specific Gravity, Urine: 1.014 (ref 1.005–1.030)
pH: 5 (ref 5.0–8.0)

## 2024-03-29 LAB — CBC WITH DIFFERENTIAL/PLATELET
Abs Immature Granulocytes: 0.03 10*3/uL (ref 0.00–0.07)
Basophils Absolute: 0 10*3/uL (ref 0.0–0.1)
Basophils Relative: 0 %
Eosinophils Absolute: 0.1 10*3/uL (ref 0.0–0.5)
Eosinophils Relative: 1 %
HCT: 45.8 % (ref 36.0–46.0)
Hemoglobin: 15.5 g/dL — ABNORMAL HIGH (ref 12.0–15.0)
Immature Granulocytes: 0 %
Lymphocytes Relative: 33 %
Lymphs Abs: 3.5 10*3/uL (ref 0.7–4.0)
MCH: 30 pg (ref 26.0–34.0)
MCHC: 33.8 g/dL (ref 30.0–36.0)
MCV: 88.8 fL (ref 80.0–100.0)
Monocytes Absolute: 0.5 10*3/uL (ref 0.1–1.0)
Monocytes Relative: 4 %
Neutro Abs: 6.4 10*3/uL (ref 1.7–7.7)
Neutrophils Relative %: 62 %
Platelets: 284 10*3/uL (ref 150–400)
RBC: 5.16 MIL/uL — ABNORMAL HIGH (ref 3.87–5.11)
RDW: 12.3 % (ref 11.5–15.5)
WBC: 10.5 10*3/uL (ref 4.0–10.5)
nRBC: 0 % (ref 0.0–0.2)

## 2024-03-29 LAB — BASIC METABOLIC PANEL WITH GFR
Anion gap: 14 (ref 5–15)
BUN: 14 mg/dL (ref 6–20)
CO2: 22 mmol/L (ref 22–32)
Calcium: 9.4 mg/dL (ref 8.9–10.3)
Chloride: 103 mmol/L (ref 98–111)
Creatinine, Ser: 0.65 mg/dL (ref 0.44–1.00)
GFR, Estimated: >60 mL/min
Glucose, Bld: 105 mg/dL — ABNORMAL HIGH (ref 70–99)
Potassium: 3.4 mmol/L — ABNORMAL LOW (ref 3.5–5.1)
Sodium: 139 mmol/L (ref 135–145)

## 2024-03-29 NOTE — ED Triage Notes (Signed)
 Pt comes in for lower abd pain and vaginal bleeding. Pt was scheduled for a Oophorectomy but their was too much scar tissue from cysts and partial hysterectomy. Pt is scheduled to go back for the Oophorectomy w/ robotics on 2/10. Pt has been having n/v and lower abd pain. Pt is states she found some blood on her sheets this morning and some blood when she wipes. She states pain is like when a cysts bursts. A&Ox4.
